# Patient Record
Sex: Female | Born: 2000 | Hispanic: No | Marital: Single | Smoking: Never smoker
Health system: Southern US, Community
[De-identification: ages and names within clinical notes are randomized; demographics above are authoritative.]

## PROBLEM LIST (undated history)

## (undated) DIAGNOSIS — F32A Depression, unspecified: Secondary | ICD-10-CM

## (undated) DIAGNOSIS — F419 Anxiety disorder, unspecified: Secondary | ICD-10-CM

## (undated) DIAGNOSIS — D573 Sickle-cell trait: Secondary | ICD-10-CM

## (undated) DIAGNOSIS — F909 Attention-deficit hyperactivity disorder, unspecified type: Secondary | ICD-10-CM

## (undated) HISTORY — DX: Depression, unspecified: F32.A

## (undated) HISTORY — DX: Attention-deficit hyperactivity disorder, unspecified type: F90.9

## (undated) HISTORY — PX: HERNIA REPAIR: SHX51

## (undated) HISTORY — DX: Sickle-cell trait: D57.3

## (undated) HISTORY — DX: Anxiety disorder, unspecified: F41.9

---
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
## 2020-07-21 ENCOUNTER — Ambulatory Visit: Payer: Self-pay | Admitting: Internal Medicine

## 2020-08-18 ENCOUNTER — Other Ambulatory Visit: Payer: Self-pay

## 2020-08-18 ENCOUNTER — Ambulatory Visit: Payer: 59 | Admitting: Nurse Practitioner

## 2020-08-18 ENCOUNTER — Encounter: Payer: Self-pay | Admitting: Nurse Practitioner

## 2020-08-18 ENCOUNTER — Other Ambulatory Visit (HOSPITAL_COMMUNITY)
Admission: RE | Admit: 2020-08-18 | Discharge: 2020-08-18 | Disposition: A | Discharge status: Home / Self Care | Payer: 59 | Source: Ambulatory Visit | Attending: Nurse Practitioner | Admitting: Nurse Practitioner

## 2020-08-18 VITALS — BP 100/68 | HR 98 | Temp 97.7°F | Ht 60.5 in | Wt 125.2 lb

## 2020-08-18 DIAGNOSIS — K219 Gastro-esophageal reflux disease without esophagitis: Secondary | ICD-10-CM | POA: Insufficient documentation

## 2020-08-18 DIAGNOSIS — J301 Allergic rhinitis due to pollen: Secondary | ICD-10-CM | POA: Insufficient documentation

## 2020-08-18 DIAGNOSIS — Z113 Encounter for screening for infections with a predominantly sexual mode of transmission: Secondary | ICD-10-CM | POA: Insufficient documentation

## 2020-08-18 DIAGNOSIS — B009 Herpesviral infection, unspecified: Secondary | ICD-10-CM | POA: Insufficient documentation

## 2020-08-18 DIAGNOSIS — R197 Diarrhea, unspecified: Secondary | ICD-10-CM | POA: Insufficient documentation

## 2020-08-18 DIAGNOSIS — F418 Other specified anxiety disorders: Secondary | ICD-10-CM | POA: Insufficient documentation

## 2020-08-18 DIAGNOSIS — F5104 Psychophysiologic insomnia: Secondary | ICD-10-CM | POA: Insufficient documentation

## 2020-08-18 DIAGNOSIS — Z3041 Encounter for surveillance of contraceptive pills: Secondary | ICD-10-CM

## 2020-08-18 DIAGNOSIS — Z91018 Allergy to other foods: Secondary | ICD-10-CM | POA: Insufficient documentation

## 2020-08-18 LAB — POCT URINE PREGNANCY: Preg Test, Ur: NEGATIVE

## 2020-08-18 NOTE — Progress Notes (Signed)
Subjective:  Patient ID: Erin Bentley, female    DOB: [birth year]  Age: 19 y.o. MRN: [id number]  CC: Establish Care (New patient/Would like to discuss birth control)  [place]. [place]ill on Junel Fe (OCP). Last OV with previous pcp was 06/2019 per patient. She is sexually active with occasional use of condoms. She denies any need for HIV screen. She does not use any tobacco product, no Hx of PE/DVT/migraines. FHx of breast cabcer (MGM at ago 68).   Reviewed past Medical, Social and Family history today.  Outpatient Medications Prior to Visit  Medication Sig Dispense Refill  . albuterol (VENTOLIN HFA) 108 (90 Base) MCG/ACT inhaler Inhale into the lungs.    . Cetirizine HCl 10 MG CAPS Take by mouth.    . cloNIDine (CATAPRES) 0.1 MG tablet 1 tablet in the evening    . FLUoxetine (PROZAC) 40 MG capsule Take 40 mg by mouth daily.    Marland Kitchen LORazepam (ATIVAN) 1 MG tablet Take 1 mg by mouth 2 (two) times daily.    . valACYclovir (VALTREX) 500 MG tablet 1 tablet as needed for cold sores    . norethindrone-ethinyl estradiol (JUNEL FE 1/20) 1-20 MG-MCG tablet 1 tablet     No facility-administered medications prior to visit.    ROS See HPI  Objective:  BP 100/68 (BP Location: Left Arm, Patient Position: Sitting, Cuff Size: Normal)   Pulse 98   Temp 97.7 F (36.5 C) (Temporal)   Ht 5' 0.5" (1.537 m)   Wt 125 lb 3.2 oz (56.8 kg)   SpO2 98%   BMI 24.05 kg/m   Physical Exam Vitals reviewed.  Cardiovascular:     Rate and Rhythm: Normal rate and regular rhythm.     Pulses: Normal pulses.     Heart sounds: Normal heart sounds.  Pulmonary:     Effort: Pulmonary effort is normal.     Breath sounds: Normal breath sounds.  Musculoskeletal:     Right lower leg: No edema.     Left lower leg: No edema.  Neurological:     Mental Status: She is alert and oriented to person, place, and time.  Psychiatric:        Mood and Affect: Mood normal.         Behavior: Behavior normal.        Thought Content: Thought content normal.    Assessment & Plan:  This visit occurred during the SARS-CoV-2 public health emergency.  Safety protocols were in place, including screening questions prior to the visit, additional usage of staff PPE, and extensive cleaning of exam room while observing appropriate contact time as indicated for disinfecting solutions.   Erin Bentley was seen today for establish care.  Diagnoses and all orders for this visit:  Encounter for surveillance of contraceptive pills -     POCT urine pregnancy -     norethindrone-ethinyl estradiol (JUNEL FE 1/20) 1-20 MG-MCG tablet; Take 1 tablet by mouth daily. Need CPE with PAP in order to get additional refills  Screen for STD (sexually transmitted disease) -     Urine cytology ancillary only([employer])  Advised about need for first PAP smear screen. She decided to schedule another appt.  Problem List Items Addressed This Visit    None    Visit Diagnoses    Encounter for surveillance of contraceptive pills    -  Primary   Relevant Medications   norethindrone-ethinyl estradiol (JUNEL FE 1/20) 1-20  MG-MCG tablet   Other Relevant Orders   POCT urine pregnancy (Completed)   Screen for STD (sexually transmitted disease)       Relevant Orders   Urine cytology ancillary only([facility]) (Completed)      Follow-up: Return in about 4 weeks (around 09/15/2020) for CPE (fasting).  Alysia Penna, NP

## 2020-08-18 NOTE — Patient Instructions (Signed)
Thank you for choosing [facility] for your health needs  Go to lab for urine collection.   What You Need to Know About Marijuana Use Marijuana is a mixture of the dried leaves and flowers of the hemp plant Cannabis sativa. The plant's active ingredients (cannabinoids) change the chemistry of the brain. If you smoke or eat marijuana, you will experience changes in the way you think, feel, and behave. Many people use marijuana because it helps them relax and puts them in a pleasurable mood (marijuana high). Some people use marijuana for medical effects, such as:  Reduced nausea.  Increased appetite.  Reduced muscle spasm.  Pain relief. Researchers are studying other possible medical uses for marijuana. How can marijuana use affect me? Many people find a marijuana high to be pleasurable and relaxing. Other people find a marijuana high to be uncomfortable or anxiety-causing. This drug can cause short-term and long-term physical and mental effects. Taking high doses of marijuana or trying to quit marijuana can also affect you. Short-term effects of marijuana use include:  Temporary relief of symptoms from a medical condition.  Changes in mood and perception (feeling high).  Increased hunger.  Increased heart rate.  Slowed movement and reaction time.  Poor memory, judgment, and problem solving ability.  Altered sense of time.  Changes to vision.  Bloodshot eyes.  Coughing. Long-term effects of marijuana use include:  Higher risk of lung and breathing problems.  Higher risk of heart attack.  Higher risk of testicular cancer.  Mental and physical dependence (addiction).  Slowed brain development in young people. Babies whose mothers used marijuana during pregnancy have an increased risk of problems with brain development and behavior.  Temporary periods of false perceptions or beliefs (hallucinations or paranoia).  Worsening of mental illness.  Onset of new  mental illness such as anxiety, depression, or suicidal thoughts.  Withdrawal symptoms when stopping marijuana, such as sleeplessness, anxiety, cravings, and anger.  Difficulty maintaining healthy relationships.  Poor memory, and difficulty concentrating and learning. This can result in decreased intelligence and poor performance at school or work, and an increased risk of dropping out of school.  Higher risk of using other substances like alcohol and nicotine. High doses of marijuana can cause:  Panic.  Anxiety.  Mental confusion.  Hallucinations. Quitting marijuana after using it for a long time can cause withdrawal symptoms, such as:  Headache.  Shakiness.  Sweating.  Stomach pain.  Nausea.  Restlessness.  Irritability.  Trouble sleeping.  Decreased appetite. What are the benefits of not using marijuana? Not using marijuana can keep you from becoming dependent on it. You can avoid the negative effects of the drug that can reduce your quality of life. You can avoid accidents caused by the slowed reaction time that is common with marijuana use. If I already use marijuana, what steps can I take to stop using it? If you are not physically or mentally dependent on marijuana, you should be able to stop using it on your own. If you cannot stop on your own, ask your health care provider for help. Treatment for marijuana addiction is similar to treatment for other addictions. It may include:  Cognitive-behavioral therapy (psychotherapy). This may include individual or group therapy.  Joining a support group.  Treating medical, behavioral, or mental health conditions that exist along with marijuana dependency.  Where can I get more information? Learn more about:  Marijuana from the [place] [employer] on Drug Abuse: Natworking.hu  Medical marijuana from the  [employer] of Health:  RunningShows.co.za  Treatment options from the Substance Abuse and Mental Health Services Administration: https://findtreatment.http://gonzalez-rivas.net/  Recovery from marijuana dependency from Recovery.org: http://www.recovery.org/topics/marijuana-recovery When should I seek medical care? Talk with your health care provider if:  You want to stop using marijuana but you cannot.  You have withdrawal symptoms when you try to stop using marijuana.  You are using marijuana every day.  You are using marijuana along with other drugs like cocaine or alcohol.  You have anxiety or depression.  You have hallucinations or paranoia.  Marijuana use is interfering with your relationships or your ability to function normally at school or at work. Summary  You may become physically or mentally dependent on marijuana.  Long-term use may interfere with your ability to function normally at home, school, or work.  Marijuana addiction is treatable. This information is not intended to replace advice given to you by your health care provider. Make sure you discuss any questions you have with your health care provider. Document Revised: 10/07/2017 Document Reviewed: 07/14/2016 Elsevier Patient Education  2020 [employer].

## 2020-08-19 LAB — URINE CYTOLOGY ANCILLARY ONLY
Chlamydia: NEGATIVE
Comment: NEGATIVE
Comment: NEGATIVE
Comment: NORMAL
Neisseria Gonorrhea: NEGATIVE
Trichomonas: NEGATIVE

## 2020-08-21 ENCOUNTER — Other Ambulatory Visit (INDEPENDENT_AMBULATORY_CARE_PROVIDER_SITE_OTHER): Payer: Managed Care, Other (non HMO)

## 2020-08-21 ENCOUNTER — Ambulatory Visit: Payer: Managed Care, Other (non HMO) | Admitting: Physician Assistant

## 2020-08-21 ENCOUNTER — Encounter: Payer: Self-pay | Admitting: Physician Assistant

## 2020-08-21 VITALS — BP 102/70 | HR 97 | Ht 60.0 in | Wt 124.4 lb

## 2020-08-21 DIAGNOSIS — R1013 Epigastric pain: Secondary | ICD-10-CM

## 2020-08-21 DIAGNOSIS — K219 Gastro-esophageal reflux disease without esophagitis: Secondary | ICD-10-CM | POA: Diagnosis not present

## 2020-08-21 DIAGNOSIS — R197 Diarrhea, unspecified: Secondary | ICD-10-CM | POA: Diagnosis not present

## 2020-08-21 DIAGNOSIS — R110 Nausea: Secondary | ICD-10-CM

## 2020-08-21 LAB — COMPREHENSIVE METABOLIC PANEL
ALT: 9 U/L (ref 0–35)
AST: 13 U/L (ref 0–37)
Albumin: 4.6 g/dL (ref 3.5–5.2)
Alkaline Phosphatase: 31 U/L — ABNORMAL LOW (ref 39–117)
BUN: 9 mg/dL (ref 6–23)
CO2: 28 mEq/L (ref 19–32)
Calcium: 9.4 mg/dL (ref 8.4–10.5)
Chloride: 103 mEq/L (ref 96–112)
Creatinine, Ser: 0.69 mg/dL (ref 0.40–1.20)
GFR: 124.08 mL/min (ref >60.00)
Glucose, Bld: 83 mg/dL (ref 70–99)
Potassium: 4.4 mEq/L (ref 3.5–5.1)
Sodium: 137 mEq/L (ref 135–145)
Total Bilirubin: 0.5 mg/dL (ref 0.2–1.2)
Total Protein: 7.6 g/dL (ref 6.0–8.3)

## 2020-08-21 LAB — CBC WITH DIFFERENTIAL/PLATELET
Basophils Absolute: 0.1 10*3/uL (ref 0.0–0.1)
Basophils Relative: 0.8 % (ref 0.0–3.0)
Eosinophils Absolute: 0.4 10*3/uL (ref 0.0–0.7)
Eosinophils Relative: 4.6 % (ref 0.0–5.0)
HCT: 38.5 % (ref 36.0–46.0)
Hemoglobin: 13.5 g/dL (ref 12.0–15.0)
Lymphocytes Relative: 30.2 % (ref 12.0–46.0)
Lymphs Abs: 2.6 10*3/uL (ref 0.7–4.0)
MCHC: 35.1 g/dL (ref 30.0–36.0)
MCV: 83.5 fl (ref 78.0–100.0)
Monocytes Absolute: 0.6 10*3/uL (ref 0.1–1.0)
Monocytes Relative: 6.6 % (ref 3.0–12.0)
Neutro Abs: 5 10*3/uL (ref 1.4–7.7)
Neutrophils Relative %: 57.8 % (ref 43.0–77.0)
Platelets: 365 10*3/uL (ref 150.0–400.0)
RBC: 4.61 Mil/uL (ref 3.87–5.11)
RDW: 12.6 % (ref 11.5–15.5)
WBC: 8.6 10*3/uL (ref 4.0–10.5)

## 2020-08-21 LAB — LIPASE: Lipase: 11 U/L (ref 11.0–59.0)

## 2020-08-21 MED ORDER — PANTOPRAZOLE SODIUM 20 MG PO TBEC: 20.0000 mg | DELAYED_RELEASE_TABLET | Freq: Two times a day (BID) | ORAL | 1 refills | Status: DC

## 2020-08-21 MED ORDER — NORETHIN ACE-ETH ESTRAD-FE 1-20 MG-MCG PO TABS: 1.0000 | ORAL_TABLET | Freq: Every day | ORAL | 0 refills | Status: DC

## 2020-08-21 NOTE — Patient Instructions (Addendum)
If you are age 21 or older, your body mass index should be between 23-30. Your Body mass index is 24.29 kg/m. If this is out of the aforementioned range listed, please consider follow up with your Primary Care Provider.  If you are age 68 or younger, your body mass index should be between 19-25. Your Body mass index is 24.29 kg/m. If this is out of the aformentioned range listed, please consider follow up with your Primary Care Provider.   Your provider has requested that you go to the basement level for lab work before leaving today. Press "B" on the elevator. The lab is located at the first door on the left as you exit the elevator.  Due to recent changes in healthcare laws, you may see the results of your imaging and laboratory studies on MyChart before your provider has had a chance to review them.  We understand that in some cases there may be results that are confusing or concerning to you. Not all laboratory results come back in the same time frame and the provider may be waiting for multiple results in order to interpret others.  Please give [place] 48 hours in order for your provider to thoroughly review all the results before contacting the office for clarification of your results.   We have sent the following medications to your pharmacy for you to pick up at your convenience:  START: pantoprazole 20mg  take one tablet twice daily 30 to 60 minutes before breakfast and dinner.  You are scheduled for a follow up appointment in the office on 09-25-20 at 3:30pm.  You have been scheduled for an abdominal ultrasound at [facility] (1st floor of hospital) on 08-27-20 at 8:30am. Please arrive 15 minutes prior to your appointment for registration. Make certain not to have anything to eat or drink 6 hours prior to your appointment. Should you need to reschedule your appointment, please contact radiology at [phone]. This test typically takes about 30 minutes to perform.  Thank you for  entrusting me with your care and choosing [facility].  [facility]

## 2020-08-21 NOTE — Progress Notes (Signed)
Reviewed and agree with management plans. ? ?Adylin Hankey L. Verland Sprinkle, MD, MPH  ?

## 2020-08-21 NOTE — Progress Notes (Signed)
Chief Complaint: "Gastrointestinal issues"  HPI:    Erin Bentley is a 19 year old female with a past medical history as listed below including anxiety and depression, who presents to clinic today with a complaint of "gastrointestinal issues".    Today, the patient presents to clinic and explains that in May she started having a lot of GI issues.  Apparently she was started on Prozac around that time was told this could give her abdominal issues but she stopped this and her problems have continued.  Has lost about 15 pounds since May.  Apparently went to the urgent care initially (we cannot see these records) and was given a diagnosis of hepatitis A, but then followed with her PCP and was told that there was no sign of hepatitis A.  Also had celiac testing which was negative.  Tells me that everything she eats seems to make her sick.  She is constantly nauseous, worse with some foods especially fast food which seems to make things "10 times worse".  Has tried to cut out dairy and gluten which make no real difference.  Describes reflux symptoms and epigastric pain,which is sharp/cramping, which hurts about 20 to 30 minutes after eating and will last for a couple of hours.  Also stopped nicotine usage about 2 months ago but this has not stopped her symptoms.  Tells me that she will have diarrhea after eating anything which is a loose liquid stool.    Does have a history of anxiety but tells me that she does not feel like this is worse than it has been before, no extra stress in her life.    Currently going to cosmetology school.    Denies fever, chills, symptoms that awaken her from sleep or blood in her stool.  Past Medical History:  Diagnosis Date  . ADHD   . Anxiety   . Depression     Past Surgical History:  Procedure Laterality Date  . HERNIA REPAIR     Age 19  . WISDOM TOOTH EXTRACTION  2015    Current Outpatient Medications  Medication Sig Dispense Refill  . albuterol (VENTOLIN HFA) 108  (90 Base) MCG/ACT inhaler Inhale into the lungs.    . Cetirizine HCl 10 MG CAPS Take by mouth.    . cloNIDine (CATAPRES) 0.1 MG tablet 1 tablet in the evening    . norethindrone-ethinyl estradiol (JUNEL FE 1/20) 1-20 MG-MCG tablet Take 1 tablet by mouth daily. Need CPE with PAP in order to get additional refills 90 tablet 0  . valACYclovir (VALTREX) 500 MG tablet 1 tablet as needed for cold sores     No current facility-administered medications for this visit.    Allergies as of 08/21/2020 - Review Complete 08/18/2020  Allergen Reaction Noted  . Apple Itching and Swelling 09/11/2014  . Other Itching, Swelling, and Hives 09/11/2014    Family History  Problem Relation Age of Onset  . Arthritis Maternal Grandmother   . Diabetes Maternal Grandfather   . Diabetes Paternal Grandmother   . Cancer Paternal Grandmother 41       breast  . Diabetes Paternal Grandfather   . Heart disease Paternal Grandfather   . Colon cancer Neg Hx   . Stomach cancer Neg Hx   . Pancreatic cancer Neg Hx     Social History   Socioeconomic History  . Marital status: Single    Spouse name: Not on file  . Number of children: 0  . Years of education: Not on file  .  Highest education level: Not on file  Occupational History  . Not on file  Tobacco Use  . Smoking status: Never Smoker  . Smokeless tobacco: Never Used  Vaping Use  . Vaping Use: Some days  . Substances: Nicotine, Flavoring  Substance and Sexual Activity  . Alcohol use: Yes    Comment: Occasionally  . Drug use: Yes    Frequency: 7.0 times per week    Types: Marijuana    Comment: Daily  . Sexual activity: Yes    Birth control/protection: Condom, Pill  Other Topics Concern  . Not on file  Social History Narrative  . Not on file   Social Determinants of Health   Financial Resource Strain:   . Difficulty of Paying Living Expenses: Not on file  Food Insecurity:   . Worried About Programme researcher, broadcasting/film/video in the Last Year: Not on file  .  Ran Out of Food in the Last Year: Not on file  Transportation Needs:   . Lack of Transportation (Medical): Not on file  . Lack of Transportation (Non-Medical): Not on file  Physical Activity:   . Days of Exercise per Week: Not on file  . Minutes of Exercise per Session: Not on file  Stress:   . Feeling of Stress : Not on file  Social Connections:   . Frequency of Communication with Friends and Family: Not on file  . Frequency of Social Gatherings with Friends and Family: Not on file  . Attends Religious Services: Not on file  . Active Member of Clubs or Organizations: Not on file  . Attends Banker Meetings: Not on file  . Marital Status: Not on file  Intimate Partner Violence:   . Fear of Current or Ex-Partner: Not on file  . Emotionally Abused: Not on file  . Physically Abused: Not on file  . Sexually Abused: Not on file    Review of Systems:    Constitutional: No weight loss, fever or chills Skin: No rash  Cardiovascular: No chest pai Respiratory: No SOB  Gastrointestinal: See HPI and otherwise negative Genitourinary: No dysuria  Neurological: No headache, dizziness or syncope Musculoskeletal: No new muscle or joint pain Hematologic: No bleeding  Psychiatric:+ anxiety   Physical Exam:  Vital signs: BP 102/70   Pulse 97   Ht 5' (1.524 m)   Wt 124 lb 6 oz (56.4 kg)   BMI 24.29 kg/m   Constitutional:   Pleasant female appears to be in NAD, Well developed, Well nourished, alert and cooperative Head:  Normocephalic and atraumatic. Eyes:   PEERL, EOMI. No icterus. Conjunctiva pink. Ears:  Normal auditory acuity. Neck:  Supple Throat: Oral cavity and pharynx without inflammation, swelling or lesion.  Respiratory: Respirations even and unlabored. Lungs clear to auscultation bilaterally.   No wheezes, crackles, or rhonchi.  Cardiovascular: Normal S1, S2. No MRG. Regular rate and rhythm. No peripheral edema, cyanosis or pallor.  Gastrointestinal:  Soft,  nondistended, marked epigastric/RUQ ttp with involuntary guarding. Normal bowel sounds. No appreciable masses or hepatomegaly. Rectal:  Not performed.  Msk:  Symmetrical without gross deformities. Without edema, no deformity or joint abnormality.  Neurologic:  Alert and  oriented x4;  grossly normal neurologically.  Skin:   Dry and intact without significant lesions or rashes. Psychiatric:  Demonstrates good judgement and reason without abnormal affect or behaviors.  No recent labs or imaging.  Assessment: 1.  Epigastric/right upper quadrant pain: Over the past 5 months, a 15 pound weight loss related  to pain after eating which resulted in diarrhea, constantly nauseous with reflux symptoms; consider gastritis+/-PUD+/-GERD versus gallbladder origin 2.  Diarrhea: With above, consider IBS versus infectious cause versus other 3.  Nausea 4.  GERD  Plan: 1.  Ordered right upper quadrant ultrasound 2.  Ordered CBC, CMP and lipase. 3.  Ordered a GI pathogen panel 4.  Prescribed Pantoprazole 20 mg twice daily, 20-30 minutes before breakfast and dinner #60 with 3 refills. 5.  Patient to follow in clinic with me after labs and testing above in about 4 to 6 weeks.  If symptoms continue then would consider an EGD.  Patient assigned to Dr. Orvan Falconer today.  Hyacinth Meeker, PA-C [facility] 08/21/2020, 3:34 PM  Cc: No ref. provider found

## 2020-08-26 ENCOUNTER — Telehealth: Payer: Self-pay | Admitting: Physician Assistant

## 2020-08-26 NOTE — Telephone Encounter (Signed)
See results note. 

## 2020-08-26 NOTE — Telephone Encounter (Signed)
Pt is requesting a call back from Patty. 

## 2020-08-27 ENCOUNTER — Ambulatory Visit (HOSPITAL_COMMUNITY)
Admission: RE | Admit: 2020-08-27 | Discharge: 2020-08-27 | Disposition: A | Discharge status: Home / Self Care | Payer: 59 | Source: Ambulatory Visit | Attending: Physician Assistant | Admitting: Physician Assistant

## 2020-08-27 ENCOUNTER — Other Ambulatory Visit: Payer: Self-pay

## 2020-08-27 DIAGNOSIS — R197 Diarrhea, unspecified: Secondary | ICD-10-CM | POA: Insufficient documentation

## 2020-08-27 DIAGNOSIS — R1013 Epigastric pain: Secondary | ICD-10-CM | POA: Diagnosis present

## 2020-08-27 DIAGNOSIS — R110 Nausea: Secondary | ICD-10-CM | POA: Diagnosis present

## 2020-08-27 DIAGNOSIS — K219 Gastro-esophageal reflux disease without esophagitis: Secondary | ICD-10-CM | POA: Diagnosis present

## 2020-09-12 ENCOUNTER — Other Ambulatory Visit: Payer: Self-pay | Admitting: Physician Assistant

## 2020-09-25 ENCOUNTER — Encounter: Payer: Self-pay | Admitting: Physician Assistant

## 2020-09-25 ENCOUNTER — Ambulatory Visit: Payer: 59 | Admitting: Physician Assistant

## 2020-09-25 VITALS — BP 112/60 | HR 115 | Ht 60.0 in | Wt 127.8 lb

## 2020-09-25 DIAGNOSIS — K5900 Constipation, unspecified: Secondary | ICD-10-CM | POA: Diagnosis not present

## 2020-09-25 DIAGNOSIS — R140 Abdominal distension (gaseous): Secondary | ICD-10-CM | POA: Diagnosis not present

## 2020-09-25 NOTE — Progress Notes (Signed)
Chief Complaint: Follow-up diarrhea, GERD and abdominal pain, new complaint of constipation and bloating  HPI:    Erin Bentley is a 19 year old female, assigned to Dr. Orvan Falconer at last visit, with a past medical history of anxiety and depression, who returns to clinic today for follow-up of her diarrhea, GERD and abdominal pain, new complaint of constipation and bloating.    08/21/2020 patient seen in clinic and described that in May of this year she started with a lot of GI issues.  Lost about 15 pounds since then, had celiac testing which was negative and described that anytime she would eat something to make her sick.  She is constantly nauseous worse with fast food, tried to cut out dairy and gluten which made no difference, also describes reflux symptoms and epigastric pain.  Ordered a right upper quadrant ultrasound, CBC, CMP and lipase as well as a GI pathogen panel.  Prescribed pantoprazole 20 mg twice daily.  Discussed that if her symptoms continue then would recommend an EGD.    08/21/2020 CBC, CMP and lipase normal.    08/27/2020 right upper quadrant ultrasound was normal.    Today, the patient tells me she is completely altered her diet, she is no longer eating a lot of fatty fast food and instead is cooking at home and her symptoms are 90% better, no longer experiencing diarrhea, abdominal pain or reflux.  She is now experiencing some bloating and tells me that she will sometimes even wake up in the morning bloated.  Tells me she is also having less frequent stools with a bowel movement every other day or every 2 days when she was used to going every day.  Tells me she does drink a lot of water and is pretty active, but the bloating is bothersome to her.    Also describes that she has not had a period for 3 months.  She is going to follow-up with her PCP about this.    Denies fever, chills, weight loss, continued reflux, diarrhea or abdominal pain.  Past Medical History:  Diagnosis Date  .  ADHD   . Anxiety   . Depression     Past Surgical History:  Procedure Laterality Date  . HERNIA REPAIR     Age 59  . WISDOM TOOTH EXTRACTION  2015    Current Outpatient Medications  Medication Sig Dispense Refill  . albuterol (VENTOLIN HFA) 108 (90 Base) MCG/ACT inhaler Inhale into the lungs.    . Cetirizine HCl 10 MG CAPS Take by mouth.    . cloNIDine (CATAPRES) 0.1 MG tablet 1 tablet in the evening    . norethindrone-ethinyl estradiol (JUNEL FE 1/20) 1-20 MG-MCG tablet Take 1 tablet by mouth daily. Need CPE with PAP in order to get additional refills 90 tablet 0  . pantoprazole (PROTONIX) 20 MG tablet TAKE 1 TABLET BY MOUTH TWICE A DAY 60 tablet 5  . valACYclovir (VALTREX) 500 MG tablet 1 tablet as needed for cold sores     No current facility-administered medications for this visit.    Allergies as of 09/25/2020 - Review Complete 08/21/2020  Allergen Reaction Noted  . Apple Itching and Swelling 09/11/2014  . Other Itching, Swelling, and Hives 09/11/2014    Family History  Problem Relation Age of Onset  . Arthritis Maternal Grandmother   . Diabetes Maternal Grandfather   . Diabetes Paternal Grandmother   . Cancer Paternal Grandmother 13       breast  . Diabetes Paternal Grandfather   .  Heart disease Paternal Grandfather   . Colon cancer Neg Hx   . Stomach cancer Neg Hx   . Pancreatic cancer Neg Hx     Social History   Socioeconomic History  . Marital status: Single    Spouse name: Not on file  . Number of children: 0  . Years of education: Not on file  . Highest education level: Not on file  Occupational History  . Not on file  Tobacco Use  . Smoking status: Never Smoker  . Smokeless tobacco: Never Used  Vaping Use  . Vaping Use: Some days  . Substances: Nicotine, Flavoring  Substance and Sexual Activity  . Alcohol use: Yes    Comment: Occasionally  . Drug use: Yes    Frequency: 7.0 times per week    Types: Marijuana    Comment: Daily  . Sexual  activity: Yes    Birth control/protection: Condom, Pill  Other Topics Concern  . Not on file  Social History Narrative  . Not on file   Social Determinants of Health   Financial Resource Strain:   . Difficulty of Paying Living Expenses: Not on file  Food Insecurity:   . Worried About Programme researcher, broadcasting/film/video in the Last Year: Not on file  . Ran Out of Food in the Last Year: Not on file  Transportation Needs:   . Lack of Transportation (Medical): Not on file  . Lack of Transportation (Non-Medical): Not on file  Physical Activity:   . Days of Exercise per Week: Not on file  . Minutes of Exercise per Session: Not on file  Stress:   . Feeling of Stress : Not on file  Social Connections:   . Frequency of Communication with Friends and Family: Not on file  . Frequency of Social Gatherings with Friends and Family: Not on file  . Attends Religious Services: Not on file  . Active Member of Clubs or Organizations: Not on file  . Attends Banker Meetings: Not on file  . Marital Status: Not on file  Intimate Partner Violence:   . Fear of Current or Ex-Partner: Not on file  . Emotionally Abused: Not on file  . Physically Abused: Not on file  . Sexually Abused: Not on file    Review of Systems:    Constitutional: No weight loss, fever or chills Cardiovascular: No chest pain  Respiratory: No SOB  Gastrointestinal: See HPI and otherwise negative   Physical Exam:  Vital signs: BP 112/60   Pulse (!) 115   Ht 5' (1.524 m)   Wt 127 lb 12.8 oz (58 kg)   SpO2 98%   BMI 24.96 kg/m   Constitutional:   Pleasant female appears to be in NAD, Well developed, Well nourished, alert and cooperative  Respiratory: Respirations even and unlabored. Lungs clear to auscultation bilaterally.   No wheezes, crackles, or rhonchi.  Cardiovascular: Normal S1, S2. No MRG. Regular rate and rhythm. No peripheral edema, cyanosis or pallor.  Gastrointestinal:  Soft, mild distension, nontender. No  rebound or guarding. Normal bowel sounds. No appreciable masses or hepatomegaly. Rectal:  Not performed.  Psychiatric:  Demonstrates good judgement and reason without abnormal affect or behaviors.  RELEVANT LABS AND IMAGING: CBC    Component Value Date/Time   WBC 8.6 08/21/2020 1618   RBC 4.61 08/21/2020 1618   HGB 13.5 08/21/2020 1618   HCT 38.5 08/21/2020 1618   PLT 365.0 08/21/2020 1618   MCV 83.5 08/21/2020 1618   MCHC  35.1 08/21/2020 1618   RDW 12.6 08/21/2020 1618   LYMPHSABS 2.6 08/21/2020 1618   MONOABS 0.6 08/21/2020 1618   EOSABS 0.4 08/21/2020 1618   BASOSABS 0.1 08/21/2020 1618    CMP     Component Value Date/Time   NA 137 08/21/2020 1618   K 4.4 08/21/2020 1618   CL 103 08/21/2020 1618   CO2 28 08/21/2020 1618   GLUCOSE 83 08/21/2020 1618   BUN 9 08/21/2020 1618   CREATININE 0.69 08/21/2020 1618   CALCIUM 9.4 08/21/2020 1618   PROT 7.6 08/21/2020 1618   ALBUMIN 4.6 08/21/2020 1618   AST 13 08/21/2020 1618   ALT 9 08/21/2020 1618   ALKPHOS 31 (L) 08/21/2020 1618   BILITOT 0.5 08/21/2020 1618    Assessment: 1.  Bloating: On a daily basis, distended and somewhat uncomfortable 2.  Constipation: Over the past couple of weeks a bowel movement every 3 days or so; consider relation to new diet, likely causing above 3.  GERD/abdominal pain/diarrhea: Likely all diet related as this is better when she is not eating fast food  Plan: 1.  Discussed the bloating is likely related to constipation. 2.  Encouraged the patient to continue her water intake of the 6-8 eight ounce glasses of water per day. 3.  Recommend the patient start MiraLAX, a half a dose a day, this can be titrated as needed. 4.  Continue healthy diet. 5.  Patient to follow in clinic with me in 3-4 months or sooner if necessary.  Hyacinth Meeker, PA-C [facility] 09/25/2020, 3:24 PM  Cc: Anne Ng, NP

## 2020-09-25 NOTE — Patient Instructions (Signed)
If you are age 21 or older, your body mass index should be between 23-30. Your Body mass index is 24.96 kg/m. If this is out of the aforementioned range listed, please consider follow up with your Primary Care Provider.  If you are age 51 or younger, your body mass index should be between 19-25. Your Body mass index is 24.96 kg/m. If this is out of the aformentioned range listed, please consider follow up with your Primary Care Provider.   Start Miralax 1/2 dose daily.  Continue drinking water 6-8 oz glasses daily.  Thank you for choosing me and [facility].  Hyacinth Meeker, PA-C

## 2020-09-25 NOTE — Progress Notes (Signed)
Reviewed and agree with management plans. ? ?Murry Diaz L. Vitoria Conyer, MD, MPH  ?

## 2020-12-01 ENCOUNTER — Other Ambulatory Visit: Payer: Self-pay | Admitting: Nurse Practitioner

## 2020-12-01 DIAGNOSIS — Z3041 Encounter for surveillance of contraceptive pills: Secondary | ICD-10-CM

## 2020-12-28 ENCOUNTER — Other Ambulatory Visit: Payer: Self-pay

## 2020-12-28 ENCOUNTER — Inpatient Hospital Stay (HOSPITAL_BASED_OUTPATIENT_CLINIC_OR_DEPARTMENT_OTHER): Payer: 59

## 2020-12-28 ENCOUNTER — Encounter (HOSPITAL_COMMUNITY): Payer: Self-pay | Admitting: Emergency Medicine

## 2020-12-28 ENCOUNTER — Inpatient Hospital Stay (HOSPITAL_COMMUNITY)
Admission: AD | Admit: 2020-12-28 | Discharge: 2020-12-28 | Disposition: A | Discharge status: Home / Self Care | Payer: 59 | Attending: Obstetrics & Gynecology | Admitting: Obstetrics & Gynecology

## 2020-12-28 DIAGNOSIS — N86 Erosion and ectropion of cervix uteri: Secondary | ICD-10-CM | POA: Insufficient documentation

## 2020-12-28 DIAGNOSIS — O209 Hemorrhage in early pregnancy, unspecified: Secondary | ICD-10-CM | POA: Insufficient documentation

## 2020-12-28 DIAGNOSIS — O43122 Velamentous insertion of umbilical cord, second trimester: Secondary | ICD-10-CM | POA: Insufficient documentation

## 2020-12-28 DIAGNOSIS — N898 Other specified noninflammatory disorders of vagina: Secondary | ICD-10-CM

## 2020-12-28 DIAGNOSIS — O26899 Other specified pregnancy related conditions, unspecified trimester: Secondary | ICD-10-CM

## 2020-12-28 DIAGNOSIS — O43192 Other malformation of placenta, second trimester: Secondary | ICD-10-CM

## 2020-12-28 DIAGNOSIS — Z3A18 18 weeks gestation of pregnancy: Secondary | ICD-10-CM

## 2020-12-28 DIAGNOSIS — Z6710 Type A blood, Rh positive: Secondary | ICD-10-CM

## 2020-12-28 DIAGNOSIS — O208 Other hemorrhage in early pregnancy: Secondary | ICD-10-CM

## 2020-12-28 DIAGNOSIS — O4690 Antepartum hemorrhage, unspecified, unspecified trimester: Secondary | ICD-10-CM

## 2020-12-28 LAB — URINALYSIS, ROUTINE W REFLEX MICROSCOPIC
Bilirubin Urine: NEGATIVE
Glucose, UA: NEGATIVE mg/dL
Hgb urine dipstick: NEGATIVE
Ketones, ur: NEGATIVE mg/dL
Leukocytes,Ua: NEGATIVE
Nitrite: NEGATIVE
Protein, ur: NEGATIVE mg/dL
Specific Gravity, Urine: 1.01 (ref 1.005–1.030)
pH: 7 (ref 5.0–8.0)

## 2020-12-28 LAB — WET PREP, GENITAL
Clue Cells Wet Prep HPF POC: NONE SEEN
Sperm: NONE SEEN
Trich, Wet Prep: NONE SEEN
Yeast Wet Prep HPF POC: NONE SEEN

## 2020-12-28 LAB — TYPE AND SCREEN
ABO/RH(D): A POS
Antibody Screen: NEGATIVE

## 2020-12-28 LAB — PREGNANCY, URINE: Preg Test, Ur: POSITIVE — AB

## 2020-12-28 NOTE — MAU Provider Note (Signed)
History     CSN: [id number]  Arrival date and time: 12/28/20 9485   Event Date/Time   First Provider Initiated Contact with Patient 12/28/20 2135      Chief Complaint  Patient presents with  . Vaginal Bleeding   HPI   Ms.Erin Bentley is a 20 y.o. female G1P0 @ [redacted]w[redacted]d here in MAU with complaints of vaginal bleeding. She went to the bathroom around 5:30 pm and noticed bright, fresh blood dripping into the vagina. She called her OB office and was instructed to come to the ER. She presented to [facility] ED and was transferred to MAU for evaluation. She has no pain currently. She does get occasional sharp, shooting pain in her lower pelvis.  When she used the bathroom in MAU the blood was no longer dripping, however she did notice darker blood when she wiped. No recent intercourse, no placenta concerns that she is aware of.   OB History    Gravida  1   Para      Term      Preterm      AB      Living        SAB      IAB      Ectopic      Multiple      Live Births              Past Medical History:  Diagnosis Date  . ADHD   . Anxiety   . Depression   . Sickle cell trait [facility])     Past Surgical History:  Procedure Laterality Date  . HERNIA REPAIR     Age 47  . WISDOM TOOTH EXTRACTION  2015    Family History  Problem Relation Age of Onset  . Arthritis Maternal Grandmother   . Diabetes Maternal Grandfather   . Diabetes Paternal Grandmother   . Cancer Paternal Grandmother 23       breast  . Diabetes Paternal Grandfather   . Heart disease Paternal Grandfather   . Colon cancer Neg Hx   . Stomach cancer Neg Hx   . Pancreatic cancer Neg Hx     Social History   Tobacco Use  . Smoking status: Never Smoker  . Smokeless tobacco: Never Used  Vaping Use  . Vaping Use: Some days  . Substances: Nicotine, Flavoring  Substance Use Topics  . Alcohol use: Yes    Comment: Occasionally  . Drug use: Yes    Frequency: 7.0 times per week    Types: Marijuana     Comment: Daily    Allergies:  Allergies  Allergen Reactions  . Apple Itching and Swelling  . Other Itching, Swelling and Hives    Trees, grass, pollen, dust, pets, mold    Medications Prior to Admission  Medication Sig Dispense Refill Last Dose  . albuterol (VENTOLIN HFA) 108 (90 Base) MCG/ACT inhaler Inhale 1 puff into the lungs as needed.      . Cetirizine HCl 10 MG CAPS Take 1 capsule by mouth as needed.      . cloNIDine (CATAPRES) 0.1 MG tablet Take 0.1 mg by mouth every evening.      Colleen Can FE 1/20 1-20 MG-MCG tablet TAKE 1 TABLET BY MOUTH DAILY. NEED CPE WITH PAP IN ORDER TO GET ADDITIONAL REFILLS 84 tablet 1   . pantoprazole (PROTONIX) 20 MG tablet TAKE 1 TABLET BY MOUTH TWICE A DAY (Patient not taking: Reported on 09/25/2020) 60 tablet 5   .  valACYclovir (VALTREX) 500 MG tablet Take 500 mg by mouth as needed.       Recent Results (from the past 2160 hour(s))  Pregnancy, urine     Status: Abnormal   Collection Time: 12/28/20  8:04 PM  Result Value Ref Range   Preg Test, Ur POSITIVE (A) NEGATIVE    Comment:        THE SENSITIVITY OF THIS METHODOLOGY IS >20 mIU/mL. Performed at [facility], [address] [place], [place] [address]   Urinalysis, Routine w reflex microscopic Urine, Clean Catch     Status: Abnormal   Collection Time: 12/28/20  9:37 PM  Result Value Ref Range   Color, Urine STRAW (A) YELLOW   APPearance CLEAR CLEAR   Specific Gravity, Urine 1.010 1.005 - 1.030   pH 7.0 5.0 - 8.0   Glucose, UA NEGATIVE NEGATIVE mg/dL   Hgb urine dipstick NEGATIVE NEGATIVE   Bilirubin Urine NEGATIVE NEGATIVE   Ketones, ur NEGATIVE NEGATIVE mg/dL   Protein, ur NEGATIVE NEGATIVE mg/dL   Nitrite NEGATIVE NEGATIVE   Leukocytes,Ua NEGATIVE NEGATIVE    Comment: Performed at [facility], [address] [place], [place] [address]  Wet prep, genital     Status: Abnormal   Collection Time: 12/28/20  9:48 PM   Specimen: Cervix  Result Value Ref Range   Yeast Wet  Prep HPF POC NONE SEEN NONE SEEN   Trich, Wet Prep NONE SEEN NONE SEEN   Clue Cells Wet Prep HPF POC NONE SEEN NONE SEEN   WBC, Wet Prep HPF POC MANY (A) NONE SEEN   Sperm NONE SEEN     Comment: Performed at [facility], [address] [place], [place] [address]  GC/Chlamydia probe amp ([place])not at [facility]     Status: None   Collection Time: 12/28/20  9:48 PM  Result Value Ref Range   Chlamydia Negative    Neisseria Gonorrhea Negative    Comment Normal Reference Ranger Chlamydia - Negative    Comment      Normal Reference Range Neisseria Gonorrhea - Negative  Type and screen [facility]     Status: None   Collection Time: 12/28/20 10:34 PM  Result Value Ref Range   ABO/RH(D) A POS    Antibody Screen NEG    Sample Expiration      12/31/2020,2359 Performed at [facility], [address] [place], [place] [address]    US MFM OB LIMITED  Result Date: 12/29/2020 ----------------------------------------------------------------------  OBSTETRICS REPORT                        (Signed Final 12/29/2020 11:48 am) ---------------------------------------------------------------------- Patient Info  ID #:       [id number]                          D.O.B.:  2000-12-23 (19 yrs)  Name:       Erin Bentley                  Visit Date: 12/28/2020 10:07 pm ---------------------------------------------------------------------- Performed By  Attending:        Lin Landsmanorenthian Booker      Ref. Address:      [address]                    MD  [address] [place],                                                              [place] [address]  Performed By:     Earley Brooke     Secondary Phy.:    [facility] MAU/Triage                    BS, RDMS  Referred By:      Harolyn Rutherford             Location[facility]          [facility] NP                                  [facility]  ---------------------------------------------------------------------- Orders  #  Description                           Code        Ordered By  1  [place] MFM OB LIMITED                     [phone]    Dallen Bunte ----------------------------------------------------------------------  #  Order #                     Accession #                Episode #  1  [id number]                   [id number]                 [id number] ---------------------------------------------------------------------- Indications  Vaginal bleeding in pregnancy, second           O46.92  trimester  [redacted] weeks gestation of pregnancy                 Z3A.18 ---------------------------------------------------------------------- Fetal Evaluation  Num Of Fetuses:          1  Fetal Heart Rate(bpm):   149  Cardiac Activity:        Observed  Presentation:            Cephalic  Placenta:                Anterior  P. Cord Insertion:       Marginal insertion  Amniotic Fluid  AFI FV:      Within normal limits                              Largest Pocket(cm)                              6.2  Comment:    No placental abruption or previa identified by ultrasound. ---------------------------------------------------------------------- OB History  Gravidity:    1         Term:   0        Prem:  0        SAB:   0  TOP:          0       Ectopic:  0        Living: 0 ---------------------------------------------------------------------- Gestational Age  Clinical EDD:  18w 0d                                        EDD:   05/31/21  Best:          18w 0d     Det. By:  Clinical EDD             EDD:   05/31/21 ---------------------------------------------------------------------- Cervix Uterus Adnexa  Cervix  Length:           3.64  cm.  Normal appearance by transabdominal scan.  Right Ovary  Within normal limits.  Left Ovary  Not visualized.  Adnexa  No abnormality visualized. ---------------------------------------------------------------------- Impression  Limited exam to  assess vaginal bleeding  There is good fetal movement and amniotic fluid  There is no evidence of placental previa or placenta previa  We observed a marginal cord insertion. ---------------------------------------------------------------------- Recommendations  Follow up detailed exam at the first available appointment. ----------------------------------------------------------------------               Lin Landsman, MD Electronically Signed Final Report   12/29/2020 11:48 am ----------------------------------------------------------------------   Review of Systems  Gastrointestinal: Negative for abdominal pain.  Genitourinary: Negative for decreased urine volume, vaginal bleeding, vaginal discharge and vaginal pain.   Physical Exam   Blood pressure 107/70, pulse 99, temperature 99 F (37.2 C), temperature source Oral, resp. rate 19, height 5' (1.524 m), weight 62.1 kg, SpO2 100 %.  Physical Exam Constitutional:      General: She is not in acute distress.    Appearance: Normal appearance. She is normal weight. She is not ill-appearing, toxic-appearing or diaphoretic.  HENT:     Head: Normocephalic.  Abdominal:     Palpations: Abdomen is soft.     Tenderness: There is no abdominal tenderness.  Genitourinary:    Comments: Vagina - Small amount of white vaginal discharge, no odor, no brown discharge  Cervix - No contact bleeding, no active bleeding; ectropion noted  Bimanual exam: deferred, cervix appears closed  GC/Chlam, wet prep done Chaperone present for exam.  Neurological:     Mental Status: She is alert and oriented to person, place, and time.  Psychiatric:        Behavior: Behavior normal.     MAU Course  Procedures  MDM  + fetal heart tones via doppler A positive blood type OB limited US  Assessment and Plan   A:  1. Vaginal discharge during pregnancy, antepartum   2. Vaginal bleeding during pregnancy   3. Ectropion of cervix   4. [redacted] weeks gestation of  pregnancy   5. Type A blood, Rh positive   6. Marginal insertion of umbilical cord affecting management of mother in second trimester     P:  Discharge home in stable condition F/u with OB as scheduled Return to MAU if symptoms worsen Pelvic rest  Lynsi Dooner, Harolyn Rutherford, NP 12/31/2020 11:02 AM

## 2020-12-28 NOTE — ED Notes (Signed)
PA to see pt.  Victorino Dike provider at [facility] accepted pt.  RN spoke to [employer] who gave permission to go.  PT transport.

## 2020-12-28 NOTE — ED Provider Notes (Signed)
Emergency Medicine Provider OB Triage Evaluation Note  Erin Bentley is a 20 y.o. female, No obstetric history on file., at Unknown gestation who presents to the emergency department with complaints of vaginal bleeding.  Patient reports is [redacted] weeks pregnant, first pregnancy.  Around 530 this afternoon she noticed vaginal bleeding and lower abdominal cramping sensation which is been mild intermittent no clear aggravating or alleviating factors no radiation of pain.  She was informed by her OB/GYN to come to this ED for evaluation.  Review of  Systems  Positive: Vaginal bleeding, lower abdominal cramping Negative: Fever chills chest pain dysuria hematuria injury or any additional concerns.  Physical Exam  BP 123/76 (BP Location: Right Arm)   Pulse 97   Temp 98.1 F (36.7 C) (Oral)   Resp 18   Ht 5' (1.524 m)   Wt 61.2 kg   SpO2 100%   BMI 26.35 kg/m  General: Awake, no distress  HEENT: Atraumatic  Resp: Normal effort  Cardiac: Normal rate Abd: Nondistended, nontender  MSK: Moves all extremities without difficulty Neuro: Speech clear  Medical Decision Making  Pt evaluated for pregnancy concern and is stable for transfer to MAU. Pt is in agreement with plan for transfer.  Pregnancy test in the ER is positive  8:42 PM Discussed with MAU APP, Victorino Dike, who accepts patient in transfer.  Clinical Impression  No diagnosis found.     Elizabeth Palau 12/28/20 2043    Cheryll Cockayne, MD 12/29/20 [phone]

## 2020-12-28 NOTE — ED Triage Notes (Signed)
Pt reports she is [redacted] weeks pregnant and began to have vaginal bleeding tonight.  Her OB instructed her to come to ED.  Only "minor cramping."

## 2020-12-28 NOTE — Discharge Instructions (Signed)
Activity Restriction During Pregnancy Your health care provider may recommend specific activity restrictions during pregnancy for a variety of reasons. Activity restriction may require that you limit activities that require great effort, such as exercise, lifting, or sex. The type of activity restriction will vary for each person, depending on your risk or the problems you are having. Activity restriction may be recommended for a period of time until your baby is delivered. Why are activity restrictions recommended? Activity restriction may be recommended if:  Your placenta is partially or completely covering the opening of your cervix (placenta previa).  There is bleeding between the wall of the uterus and the amniotic sac in the first trimester of pregnancy (subchorionic hemorrhage).  You went into labor too early (preterm labor).  You have a history of miscarriage.  You have a condition that causes high blood pressure during pregnancy (preeclampsia or eclampsia).  You are pregnant with more than one baby.  Your baby is not growing well. What are the risks? The risks depend on your specific restriction. Strict bed rest has the most physical and emotional risks and is no longer routinely recommended. Risks of strict bed rest include:  Loss of muscle conditioning from not moving.  Blood clots.  Social isolation.  Depression.  Loss of income. Talk with your health care team about activity restriction to decide if it is best for you and your baby. Even if you are having problems during your pregnancy, you may be able to continue with normal levels of activity with careful monitoring by your health care team. Follow these instructions at home: If needed, based on your overall health and the health of your baby, your health care provider will decide which type of activity restriction is right for you. Activity restrictions may include:  Not lifting anything heavier than 10 pounds (4.5  kg).  Avoiding activities that take a lot of physical effort.  No lifting or straining.  Resting in a sitting position or lying down for periods of time during the day. Pelvic rest may be recommended along with activity restrictions. If pelvic rest is recommended, then:  Do not have sex, an orgasm, or use sexual stimulation.  Do not use tampons. Do not douche. Do not put anything into your vagina.  Do not lift anything that is heavier than 10 lb (4.5 kg).  Avoid activities that require a lot of effort.  Avoid any activity in which your pelvic muscles could become strained, such as squatting.   Questions to ask your health care provider  Why is my activity being limited?  How will activity restrictions affect my body?  Why is rest helpful for me and my baby?  What activities can I do?  When can I return to normal activities? When should I seek immediate medical care? Seek immediate medical care if you have:  Vaginal bleeding.  Vaginal discharge.  Cramping pain in your lower abdomen.  Regular contractions.  A low, dull backache. Summary  Your health care provider may recommend specific activity restrictions during pregnancy for a variety of reasons.  Activity restriction may require that you limit activities such as exercise, lifting, sex, or any other activity that requires great effort.  Discuss the risks and benefits of activity restriction with your health care team to decide if it is best for you and your baby.  Contact your health care provider right away if you think you are having contractions, or if you notice vaginal bleeding, discharge, or cramping. This information   is not intended to replace advice given to you by your health care provider. Make sure you discuss any questions you have with your health care provider. Document Revised: 07/18/2019 Document Reviewed: 02/14/2018 Elsevier Patient Education  2021 Elsevier Inc.  

## 2020-12-28 NOTE — MAU Note (Signed)
.  Erin Bentley is a 20 y.o. at approx [redacted]w[redacted]d in MAU reporting: When she woke up from her nap around 1715, she went to use the restroom and reports blood "was dripping out of me and was bright red like period blood." She is not currently wearing a pad and reports she has not felt any blood dripping since. She is endorsing lower abdominal aching/cramping that is occasionally sharp but states this has been occurring since the beginning of her second trimester. Last intercourse two days ago.   Pain score: 6/10 lower abdominal aching/cramping  OB/GYN care at [facility]. Next appt 01/06/2021.

## 2020-12-29 LAB — GC/CHLAMYDIA PROBE AMP (~~LOC~~) NOT AT ARMC
Chlamydia: NEGATIVE
Comment: NEGATIVE
Comment: NORMAL
Neisseria Gonorrhea: NEGATIVE

## 2021-02-24 ENCOUNTER — Inpatient Hospital Stay (HOSPITAL_COMMUNITY)
Admission: AD | Admit: 2021-02-24 | Discharge: 2021-02-24 | Disposition: A | Discharge status: Home / Self Care | Payer: 59 | Attending: Obstetrics & Gynecology | Admitting: Obstetrics & Gynecology

## 2021-02-24 ENCOUNTER — Inpatient Hospital Stay (HOSPITAL_COMMUNITY): Payer: 59

## 2021-02-24 ENCOUNTER — Other Ambulatory Visit: Payer: Self-pay

## 2021-02-24 ENCOUNTER — Encounter (HOSPITAL_COMMUNITY): Payer: Self-pay | Admitting: Obstetrics & Gynecology

## 2021-02-24 DIAGNOSIS — O368120 Decreased fetal movements, second trimester, not applicable or unspecified: Secondary | ICD-10-CM | POA: Insufficient documentation

## 2021-02-24 DIAGNOSIS — R059 Cough, unspecified: Secondary | ICD-10-CM

## 2021-02-24 DIAGNOSIS — J101 Influenza due to other identified influenza virus with other respiratory manifestations: Secondary | ICD-10-CM | POA: Diagnosis not present

## 2021-02-24 DIAGNOSIS — O99891 Other specified diseases and conditions complicating pregnancy: Secondary | ICD-10-CM | POA: Diagnosis not present

## 2021-02-24 DIAGNOSIS — Z3A26 26 weeks gestation of pregnancy: Secondary | ICD-10-CM | POA: Diagnosis not present

## 2021-02-24 DIAGNOSIS — M549 Dorsalgia, unspecified: Secondary | ICD-10-CM | POA: Diagnosis not present

## 2021-02-24 DIAGNOSIS — O99512 Diseases of the respiratory system complicating pregnancy, second trimester: Secondary | ICD-10-CM | POA: Diagnosis not present

## 2021-02-24 DIAGNOSIS — Z20822 Contact with and (suspected) exposure to covid-19: Secondary | ICD-10-CM | POA: Diagnosis not present

## 2021-02-24 LAB — COMPREHENSIVE METABOLIC PANEL
ALT: 24 U/L (ref 0–44)
AST: 23 U/L (ref 15–41)
Albumin: 3.5 g/dL (ref 3.5–5.0)
Alkaline Phosphatase: 69 U/L (ref 38–126)
Anion gap: 9 (ref 5–15)
BUN: 5 mg/dL — ABNORMAL LOW (ref 6–20)
CO2: 21 mmol/L — ABNORMAL LOW (ref 22–32)
Calcium: 9.1 mg/dL (ref 8.9–10.3)
Chloride: 101 mmol/L (ref 98–111)
Creatinine, Ser: 0.57 mg/dL (ref 0.44–1.00)
GFR, Estimated: >60 mL/min (ref >60)
Glucose, Bld: 92 mg/dL (ref 70–99)
Potassium: 3.6 mmol/L (ref 3.5–5.1)
Sodium: 131 mmol/L — ABNORMAL LOW (ref 135–145)
Total Bilirubin: 0.4 mg/dL (ref 0.3–1.2)
Total Protein: 7 g/dL (ref 6.5–8.1)

## 2021-02-24 LAB — CBC WITH DIFFERENTIAL/PLATELET
Abs Immature Granulocytes: 0.12 10*3/uL — ABNORMAL HIGH (ref 0.00–0.07)
Basophils Absolute: 0 10*3/uL (ref 0.0–0.1)
Basophils Relative: 0 %
Eosinophils Absolute: 0 10*3/uL (ref 0.0–0.5)
Eosinophils Relative: 0 %
HCT: 34.3 % — ABNORMAL LOW (ref 36.0–46.0)
Hemoglobin: 12.1 g/dL (ref 12.0–15.0)
Immature Granulocytes: 1 %
Lymphocytes Relative: 4 %
Lymphs Abs: 0.5 10*3/uL — ABNORMAL LOW (ref 0.7–4.0)
MCH: 28.3 pg (ref 26.0–34.0)
MCHC: 35.3 g/dL (ref 30.0–36.0)
MCV: 80.3 fL (ref 80.0–100.0)
Monocytes Absolute: 1.2 10*3/uL — ABNORMAL HIGH (ref 0.1–1.0)
Monocytes Relative: 10 %
Neutro Abs: 10.1 10*3/uL — ABNORMAL HIGH (ref 1.7–7.7)
Neutrophils Relative %: 85 %
Platelets: 282 10*3/uL (ref 150–400)
RBC: 4.27 MIL/uL (ref 3.87–5.11)
RDW: 12.9 % (ref 11.5–15.5)
WBC: 12 10*3/uL — ABNORMAL HIGH (ref 4.0–10.5)
nRBC: 0 % (ref 0.0–0.2)

## 2021-02-24 LAB — URINALYSIS, ROUTINE W REFLEX MICROSCOPIC
Bilirubin Urine: NEGATIVE
Glucose, UA: NEGATIVE mg/dL
Ketones, ur: NEGATIVE mg/dL
Leukocytes,Ua: NEGATIVE
Nitrite: NEGATIVE
Protein, ur: NEGATIVE mg/dL
Specific Gravity, Urine: 1.001 — ABNORMAL LOW (ref 1.005–1.030)
pH: 7 (ref 5.0–8.0)

## 2021-02-24 LAB — RESP PANEL BY RT-PCR (FLU A&B, COVID) ARPGX2
Influenza A by PCR: POSITIVE — AB
Influenza B by PCR: NEGATIVE
SARS Coronavirus 2 by RT PCR: NEGATIVE

## 2021-02-24 MED ORDER — ACETAMINOPHEN 500 MG PO TABS
1000.0000 mg | ORAL_TABLET | Freq: Once | ORAL | Status: AC
  Administered 2021-02-24: 1000 mg via ORAL
  Filled 2021-02-24: qty 2

## 2021-02-24 MED ORDER — CYCLOBENZAPRINE HCL 10 MG PO TABS
10.0000 mg | ORAL_TABLET | Freq: Three times a day (TID) | ORAL | 0 refills | Status: AC | PRN
Start: 2021-02-24 — End: ?

## 2021-02-24 MED ORDER — CYCLOBENZAPRINE HCL 5 MG PO TABS
10.0000 mg | ORAL_TABLET | Freq: Once | ORAL | Status: DC
  Filled 2021-02-24: qty 2

## 2021-02-24 MED ORDER — LACTATED RINGERS IV BOLUS
1000.0000 mL | Freq: Once | INTRAVENOUS | Status: AC
  Administered 2021-02-24: 1000 mL via INTRAVENOUS

## 2021-02-24 NOTE — MAU Note (Signed)
Pt reports fever off/on all day today (100.5), mid lower back pain all day today and is getting worse. Denies bleeding. Reports no fetal movement today (fht's 175 in triage.)

## 2021-02-24 NOTE — MAU Provider Note (Signed)
Chief Complaint:  Back Pain, Fever, and Decreased Fetal Movement   Event Date/Time   [facility] Provider Initiated Contact with Patient 02/24/21 0225     HPI: Erin Bentley is a 20 y.o. G1P0 at [redacted]w[redacted]d who presents to maternity admissions reporting worsening cough, low back pain, fever and general feeling of malaise. Also has occasional nausea. Has had this for the past week, but the back pain has gotten worse throughout the day. Has not felt the baby move much at all today but denies vaginal bleeding/discharge, contractions, cramping or loss of fluid. Was worked up for this a week ago, tested negative 3x for Covid.  Pregnancy Course: Receives care at [facility]  Past Medical History:  Diagnosis Date  . ADHD   . Anxiety   . Depression   . Sickle cell trait (HCC)    OB History  Gravida Para Term Preterm AB Living  1            SAB IAB Ectopic Multiple Live Births               # Outcome Date GA Lbr Len/2nd Weight Sex Delivery Anes PTL Lv  1 Current            Past Surgical History:  Procedure Laterality Date  . HERNIA REPAIR     Age 65  . WISDOM TOOTH EXTRACTION  2015   Family History  Problem Relation Age of Onset  . Arthritis Maternal Grandmother   . Diabetes Maternal Grandfather   . Diabetes Paternal Grandmother   . Cancer Paternal Grandmother 61       breast  . Diabetes Paternal Grandfather   . Heart disease Paternal Grandfather   . Colon cancer Neg Hx   . Stomach cancer Neg Hx   . Pancreatic cancer Neg Hx    Social History   Tobacco Use  . Smoking status: Never Smoker  . Smokeless tobacco: Never Used  Vaping Use  . Vaping Use: Some days  . Substances: Nicotine, Flavoring  Substance Use Topics  . Alcohol use: Not Currently    Comment: Occasionally  . Drug use: Not Currently    Frequency: 7.0 times per week    Types: Marijuana    Comment: not currently   Allergies  Allergen Reactions  . Apple Itching and Swelling  . Other Itching, Swelling and Hives     Trees, grass, pollen, dust, pets, mold   Medications Prior to Admission  Medication Sig Dispense Refill Last Dose  . Prenatal Vit-Fe Fumarate-FA (PRENATAL MULTIVITAMIN) TABS tablet Take 1 tablet by mouth daily at 12 noon.   02/23/2021 at Unknown time  . albuterol (VENTOLIN HFA) 108 (90 Base) MCG/ACT inhaler Inhale 1 puff into the lungs as needed.      . Cetirizine HCl 10 MG CAPS Take 1 capsule by mouth as needed.      . pantoprazole (PROTONIX) 20 MG tablet TAKE 1 TABLET BY MOUTH TWICE A DAY (Patient not taking: Reported on 09/25/2020) 60 tablet 5   . valACYclovir (VALTREX) 500 MG tablet Take 500 mg by mouth as needed.        I have reviewed patient's Past Medical Hx, Surgical Hx, Family Hx, Social Hx, medications and allergies.   ROS:  Review of Systems  Constitutional: Positive for appetite change (not hungry), fatigue and fever.  HENT: Positive for congestion. Negative for sore throat.   Eyes: Negative for visual disturbance.  Respiratory: Positive for cough. Negative for shortness of breath.  Gastrointestinal: Positive for nausea. Negative for abdominal pain and vomiting.  Genitourinary: Negative for dysuria, vaginal bleeding and vaginal discharge.  Musculoskeletal: Positive for back pain (middle to lower back).  Neurological: Negative for dizziness, syncope, light-headedness and headaches.    Physical Exam   Patient Vitals for the past 24 hrs:  BP Temp Temp src Pulse Resp SpO2 Height Weight  02/24/21 0133 119/71 99.5 F (37.5 C) Oral (!) 131 17 98 % 5' (1.524 m) 155 lb (70.3 kg)   Constitutional: Well-developed, well-nourished female acute distress, crying r/t back pain  Cardiovascular: tachycardic, normal rhythm Respiratory: normal effort, lung sounds clear throughout GI: Abd soft, non-tender, gravid appropriate for gestational age MS: Extremities nontender, no edema, normal ROM Neurologic: Alert and oriented x 4.  GU: no CVA tenderness Pelvic exam deferred  Fetal  Tracing: initially tachycardic, overall reassuring and reactive Baseline: 175, came down to 150 after Tylenol and fluids Variability: moderate Accelerations: 10x10 (appropriate for gestational age) Decelerations: had occasional decels that resolved with position changes. Toco: mostly relaxed, some UI   Labs: Results for orders placed or performed during the hospital encounter of 02/24/21 (from the past 24 hour(s))  Urinalysis, Routine w reflex microscopic Urine, Clean Catch     Status: Abnormal   Collection Time: 02/24/21  1:51 AM  Result Value Ref Range   Color, Urine COLORLESS (A) YELLOW   APPearance CLEAR CLEAR   Specific Gravity, Urine 1.001 (L) 1.005 - 1.030   pH 7.0 5.0 - 8.0   Glucose, UA NEGATIVE NEGATIVE mg/dL   Hgb urine dipstick SMALL (A) NEGATIVE   Bilirubin Urine NEGATIVE NEGATIVE   Ketones, ur NEGATIVE NEGATIVE mg/dL   Protein, ur NEGATIVE NEGATIVE mg/dL   Nitrite NEGATIVE NEGATIVE   Leukocytes,Ua NEGATIVE NEGATIVE   RBC / HPF 0-5 0 - 5 RBC/hpf   Bacteria, UA RARE (A) NONE SEEN  CBC with Differential/Platelet     Status: Abnormal   Collection Time: 02/24/21  2:38 AM  Result Value Ref Range   WBC 12.0 (H) 4.0 - 10.5 K/uL   RBC 4.27 3.87 - 5.11 MIL/uL   Hemoglobin 12.1 12.0 - 15.0 g/dL   HCT 40.9 (L) 73.5 - 32.9 %   MCV 80.3 80.0 - 100.0 fL   MCH 28.3 26.0 - 34.0 pg   MCHC 35.3 30.0 - 36.0 g/dL   RDW 92.4 26.8 - 34.1 %   Platelets 282 150 - 400 K/uL   nRBC 0.0 0.0 - 0.2 %   Neutrophils Relative % 85 %   Neutro Abs 10.1 (H) 1.7 - 7.7 K/uL   Lymphocytes Relative 4 %   Lymphs Abs 0.5 (L) 0.7 - 4.0 K/uL   Monocytes Relative 10 %   Monocytes Absolute 1.2 (H) 0.1 - 1.0 K/uL   Eosinophils Relative 0 %   Eosinophils Absolute 0.0 0.0 - 0.5 K/uL   Basophils Relative 0 %   Basophils Absolute 0.0 0.0 - 0.1 K/uL   Immature Granulocytes 1 %   Abs Immature Granulocytes 0.12 (H) 0.00 - 0.07 K/uL  Comprehensive metabolic panel     Status: Abnormal   Collection Time:  02/24/21  2:38 AM  Result Value Ref Range   Sodium 131 (L) 135 - 145 mmol/L   Potassium 3.6 3.5 - 5.1 mmol/L   Chloride 101 98 - 111 mmol/L   CO2 21 (L) 22 - 32 mmol/L   Glucose, Bld 92 70 - 99 mg/dL   BUN 5 (L) 6 - 20 mg/dL   Creatinine,  Ser 0.57 0.44 - 1.00 mg/dL   Calcium 9.1 8.9 - 16.110.3 mg/dL   Total Protein 7.0 6.5 - 8.1 g/dL   Albumin 3.5 3.5 - 5.0 g/dL   AST 23 15 - 41 U/L   ALT 24 0 - 44 U/L   Alkaline Phosphatase 69 38 - 126 U/L   Total Bilirubin 0.4 0.3 - 1.2 mg/dL   GFR, Estimated >09>60 >60>60 mL/min   Anion gap 9 5 - 15  Resp Panel by RT-PCR (Flu A&B, Covid) Nasopharyngeal Swab     Status: Abnormal   Collection Time: 02/24/21  3:05 AM   Specimen: Nasopharyngeal Swab; Nasopharyngeal(NP) swabs in vial transport medium  Result Value Ref Range   SARS Coronavirus 2 by RT PCR NEGATIVE NEGATIVE   Influenza A by PCR POSITIVE (A) NEGATIVE   Influenza B by PCR NEGATIVE NEGATIVE   Imaging:  US RENAL  Result Date: 02/24/2021 CLINICAL DATA:  Back pain.  Pregnancy. EXAM: RENAL / URINARY TRACT ULTRASOUND COMPLETE COMPARISON:  None. FINDINGS: Right Kidney: Renal measurements: 11.9 x 5.2 x 5.6 cm = volume: 187 mL. Mild hydronephrosis. No nephrolithiasis. No solid mass. Left Kidney: Renal measurements: 12.3 x 6.2 x 5.7 cm = volume: 224 mL. Echogenicity within normal limits. No mass or hydronephrosis visualized. Bladder: Appears normal for degree of bladder distention. Other: None. IMPRESSION: Mild right hydronephrosis. Electronically Signed   By: Deatra RobinsonKevin  Herman M.D.   On: 02/24/2021 03:45   DG CHEST PORT 1 VIEW  Result Date: 02/24/2021 CLINICAL DATA:  Cough in an adult patient. EXAM: PORTABLE CHEST 1 VIEW COMPARISON:  None. FINDINGS: The heart size and mediastinal contours are within normal limits. Both lungs are clear. The visualized skeletal structures are unremarkable. IMPRESSION: Negative portable chest. Electronically Signed   By: Marnee SpringJonathon  Watts M.D.   On: 02/24/2021 04:44   MAU  Course: Orders Placed This Encounter  Procedures  . Culture, OB Urine  . Resp Panel by RT-PCR (Flu A&B, Covid) Nasopharyngeal Swab  . US RENAL  . DG CHEST PORT 1 VIEW  . Urinalysis, Routine w reflex microscopic Urine, Clean Catch  . CBC with Differential/Platelet  . Comprehensive metabolic panel  . Discharge patient   Meds ordered this encounter  Medications  . lactated ringers bolus 1,000 mL  . acetaminophen (TYLENOL) tablet 1,000 mg  . cyclobenzaprine (FLEXERIL) tablet 10 mg  . cyclobenzaprine (FLEXERIL) 10 MG tablet    Sig: Take 1 tablet (10 mg total) by mouth 3 (three) times daily as needed for muscle spasms.    Dispense:  30 tablet    Refill:  0    Order Specific Question:   Supervising Provider    Answer:   Samara SnidePRATT, TANYA S [[id number]]   MDM: Tachycardia and back pain improved after tylenol and bolus, pt declined flexeril because she drove herself  Renal U/S and CXR normal  Nasal swab positive for flu A  Pt informed of results and given OTC treatment options, Tamiflu not indicated as symptoms began over 48hrs ago  Assessment: 1. Influenza A   2. Back pain affecting pregnancy in second trimester   3. Cough in adult patient     Plan: Discharge home in stable condition with 2nd trimester precautions.  Safe med list given in AVS   Follow-up Information    [place], [facility]. Go to.   Why: as scheduled for ongoing prenatal care Contact information: [address] [place] [id number] [phone]  Allergies as of 02/24/2021      Reactions   Apple Itching, Swelling   Other Itching, Swelling, Hives   Trees, grass, pollen, dust, pets, mold      Medication List    STOP taking these medications   pantoprazole 20 MG tablet Commonly known as: PROTONIX     TAKE these medications   albuterol 108 (90 Base) MCG/ACT inhaler Commonly known as: VENTOLIN HFA Inhale 1 puff into the lungs as needed.   Cetirizine HCl 10 MG  Caps Take 1 capsule by mouth as needed.   cyclobenzaprine 10 MG tablet Commonly known as: FLEXERIL Take 1 tablet (10 mg total) by mouth 3 (three) times daily as needed for muscle spasms.   prenatal multivitamin Tabs tablet Take 1 tablet by mouth daily at 12 noon.   valACYclovir 500 MG tablet Commonly known as: VALTREX Take 500 mg by mouth as needed.      Edd Arbour, CNM, MSN, IBCLC Certified Nurse Midwife, [facility]

## 2021-02-24 NOTE — Discharge Instructions (Signed)
Safe Medications in Pregnancy   Acne:  Benzoyl Peroxide  Salicylic Acid   Backache/Headache:  Tylenol: 2 regular strength every 4 hours OR        2 Extra strength every 6 hours   Colds/Coughs/Allergies:  Benadryl (alcohol free) 25 mg every 6 hours as needed  Breath right strips  Claritin  Cepacol throat lozenges  Chloraseptic throat spray  Cold-Eeze- up to three times per day  Cough drops, alcohol free  Flonase (by prescription only)  Guaifenesin  Mucinex  Robitussin DM (plain only, alcohol free)  Saline nasal spray/drops  Sudafed (pseudoephedrine) & Actifed * use only after [redacted] weeks gestation and if you do not have high blood pressure  Tylenol  Vicks Vaporub  Zinc lozenges  Zyrtec   Constipation:  Colace  Ducolax suppositories  Fleet enema  Glycerin suppositories  Metamucil  Milk of magnesia  Miralax  Senokot  Smooth move tea   Diarrhea:  Kaopectate  Imodium A-D   *NO pepto Bismol   Hemorrhoids:  Anusol  Anusol HC  Preparation H  Tucks   Indigestion:  Tums  Maalox  Mylanta  Zantac  Pepcid   Insomnia:  Benadryl (alcohol free) 25mg  every 6 hours as needed  Tylenol PM  Unisom, no Gelcaps   Leg Cramps:  Tums  MagGel   Nausea/Vomiting:  Bonine  Dramamine  Emetrol  Ginger extract  Sea bands  Meclizine  Nausea medication to take during pregnancy:  Unisom (doxylamine succinate 25 mg tablets) Take one tablet daily at bedtime. If symptoms are not adequately controlled, the dose can be increased to a maximum recommended dose of two tablets daily (1/2 tablet in the morning, 1/2 tablet mid-afternoon and one at bedtime).  Vitamin B6 100mg  tablets. Take one tablet twice a day (up to 200 mg per day).   Skin Rashes:  Aveeno products  Benadryl cream or 25mg  every 6 hours as needed  Calamine Lotion  1% cortisone cream   Yeast infection:  Gyne-lotrimin 7  Monistat 7    **If taking multiple medications, please check labels to avoid  duplicating the same active ingredients  **take medication as directed on the label  ** Do not exceed 4000 mg of tylenol in 24 hours  **Do not take medications that contain aspirin or ibuprofen            Influenza, Adult Influenza is also called "the flu." It is an infection in the lungs, nose, and throat (respiratory tract). It spreads easily from person to person (is contagious). The flu causes symptoms that are like a cold, along with high fever and body aches. What are the causes? This condition is caused by the influenza virus. You can get the virus by:  Breathing in droplets that are in the air after a person infected with the flu coughed or sneezed.  Touching something that has the virus on it and then touching your mouth, nose, or eyes. What increases the risk? Certain things may make you more likely to get the flu. These include:  Not washing your hands often.  Having close contact with many people during cold and flu season.  Touching your mouth, eyes, or nose without first washing your hands.  Not getting a flu shot every year. You may have a higher risk for the flu, and serious problems, such as a lung infection (pneumonia), if you:  Are older than 65.  Are pregnant.  Have a weakened disease-fighting system (immune system) because of a disease or because  you are taking certain medicines.  Have a long-term (chronic) condition, such as: ? Heart, kidney, or lung disease. ? Diabetes. ? Asthma.  Have a liver disorder.  Are very overweight (morbidly obese).  Have anemia. What are the signs or symptoms? Symptoms usually begin suddenly and last 4-14 days. They may include:  Fever and chills.  Headaches, body aches, or muscle aches.  Sore throat.  Cough.  Runny or stuffy (congested) nose.  Feeling discomfort in your chest.  Not wanting to eat as much as normal.  Feeling weak or tired.  Feeling dizzy.  Feeling sick to your stomach or throwing  up. How is this treated? If the flu is found early, you can be treated with antiviral medicine. This can help to reduce how bad the illness is and how long it lasts. This may be given by mouth or through an IV tube. Taking care of yourself at home can help your symptoms get better. Your doctor may want you to:  Take over-the-counter medicines.  Drink plenty of fluids. The flu often goes away on its own. If you have very bad symptoms or other problems, you may be treated in a hospital. Follow these instructions at home: Activity  Rest as needed. Get plenty of sleep.  Stay home from work or school as told by your doctor. ? Do not leave home until you do not have a fever for 24 hours without taking medicine. ? Leave home only to go to your doctor. Eating and drinking  Take an ORS (oral rehydration solution). This is a drink that is sold at pharmacies and stores.  Drink enough fluid to keep your pee pale yellow.  Drink clear fluids in small amounts as you are able. Clear fluids include: ? Water. ? Ice chips. ? Fruit juice mixed with water. ? Low-calorie sports drinks.  Eat bland foods that are easy to digest. Eat small amounts as you are able. These foods include: ? Bananas. ? Applesauce. ? Rice. ? Lean meats. ? Toast. ? Crackers.  Do not eat or drink: ? Fluids that have a lot of sugar or caffeine. ? Alcohol. ? Spicy or fatty foods. General instructions  Take over-the-counter and prescription medicines only as told by your doctor.  Use a cool mist humidifier to add moisture to the air in your home. This can make it easier for you to breathe. ? When using a cool mist humidifier, clean it daily. Empty water and replace with clean water.  Cover your mouth and nose when you cough or sneeze.  Wash your hands with soap and water often and for at least 20 seconds. This is also important after you cough or sneeze. If you cannot use soap and water, use alcohol-based hand  sanitizer.  Keep all follow-up visits.      How is this prevented?  Get a flu shot every year. You may get the flu shot in late summer, fall, or winter. Ask your doctor when you should get your flu shot.  Avoid contact with people who are sick during fall and winter. This is cold and flu season.   Contact a doctor if:  You get new symptoms.  You have: ? Chest pain. ? Watery poop (diarrhea). ? A fever.  Your cough gets worse.  You start to have more mucus.  You feel sick to your stomach.  You throw up. Get help right away if you:  Have shortness of breath.  Have trouble breathing.  Have skin or  nails that turn a bluish color.  Have very bad pain or stiffness in your neck.  Get a sudden headache.  Get sudden pain in your face or ear.  Cannot eat or drink without throwing up. These symptoms may represent a serious problem that is an emergency. Get medical help right away. Call your local emergency services (911 in the [place]  Do not wait to see if the symptoms will go away.  Do not drive yourself to the hospital. Summary  Influenza is also called "the flu." It is an infection in the lungs, nose, and throat. It spreads easily from person to person.  Take over-the-counter and prescription medicines only as told by your doctor.  Getting a flu shot every year is the best way to not get the flu. This information is not intended to replace advice given to you by your health care provider. Make sure you discuss any questions you have with your health care provider. Document Revised: 06/13/2020 Document Reviewed: 06/13/2020 Elsevier Patient Education  2021 [employer].

## 2021-02-25 LAB — CULTURE, OB URINE: Culture: NO GROWTH

## 2021-09-10 IMAGING — US US ABDOMEN LIMITED
1 series · 14 of 25 positions shown · non-contrast
Comparison: None

CLINICAL DATA: Epigastric pain, diarrhea, weight loss

EXAM:
ULTRASOUND ABDOMEN LIMITED RIGHT UPPER QUADRANT

[Series 1: us abdomen limited · 14 of 43 slices shown]
[im 1/43]
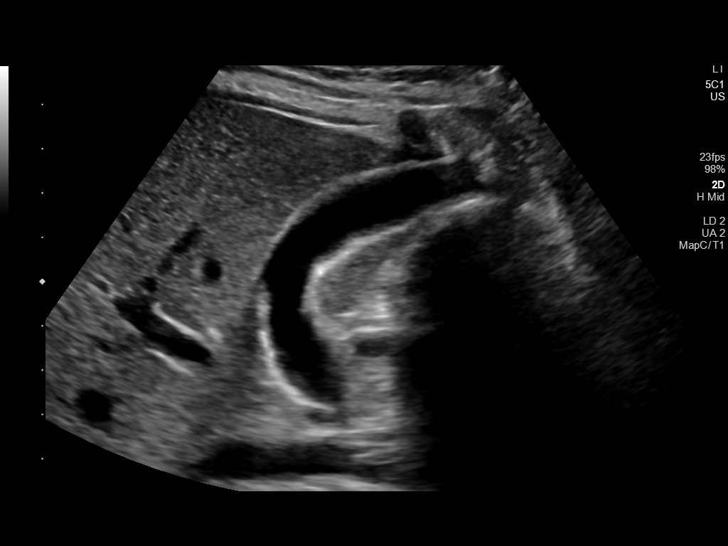
[im 4/43]
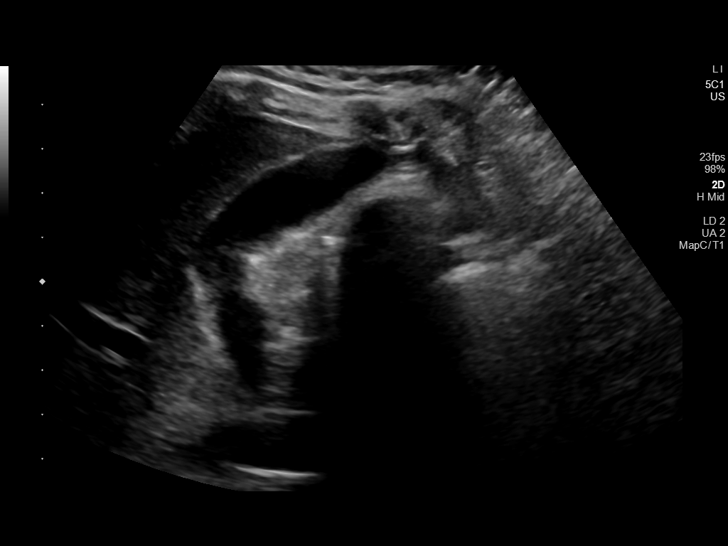
[im 8/43]
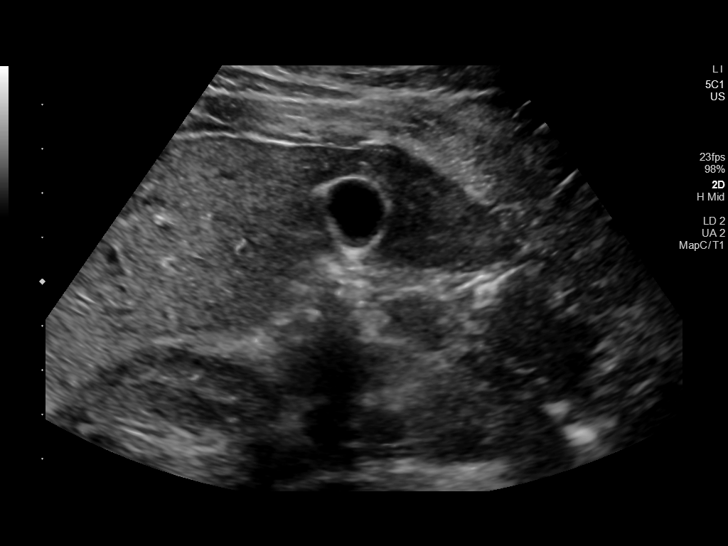
[im 11/43]
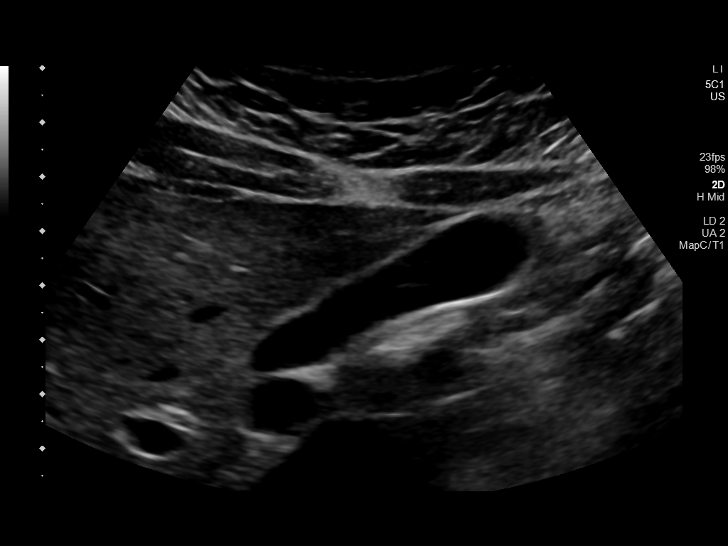
[im 15/43]
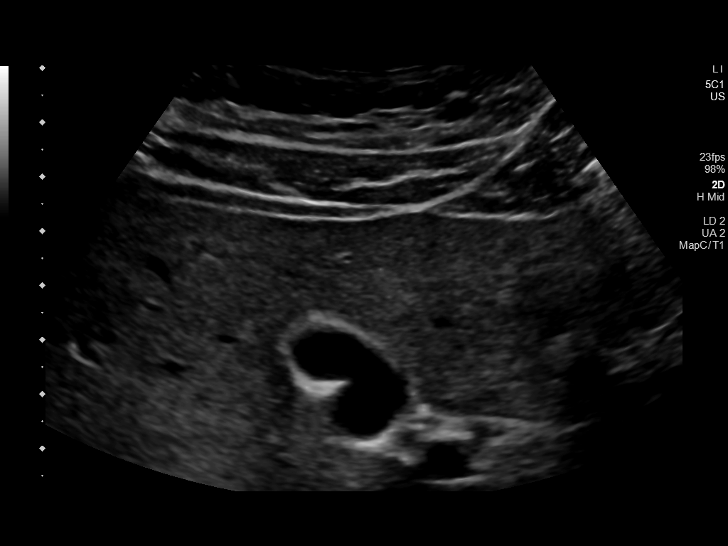
[im 16/43]
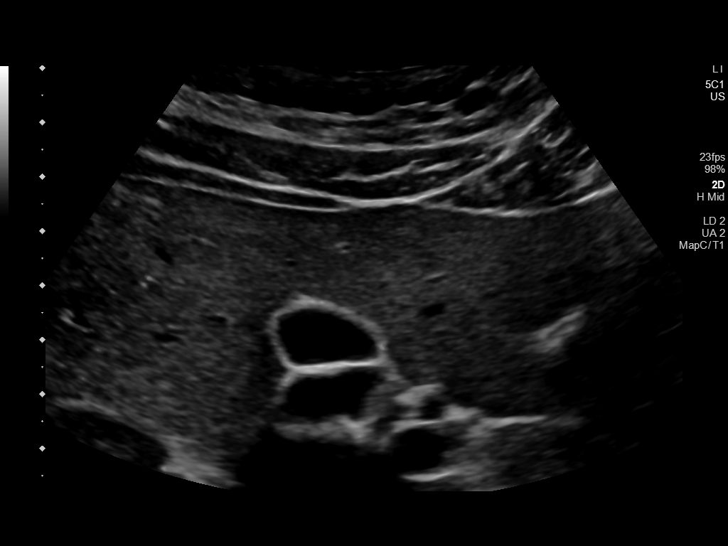
[im 20/43]
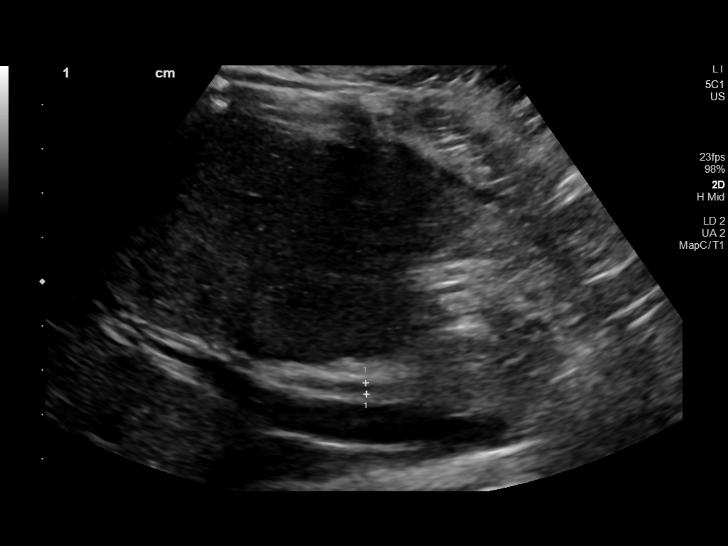
[im 23/43]
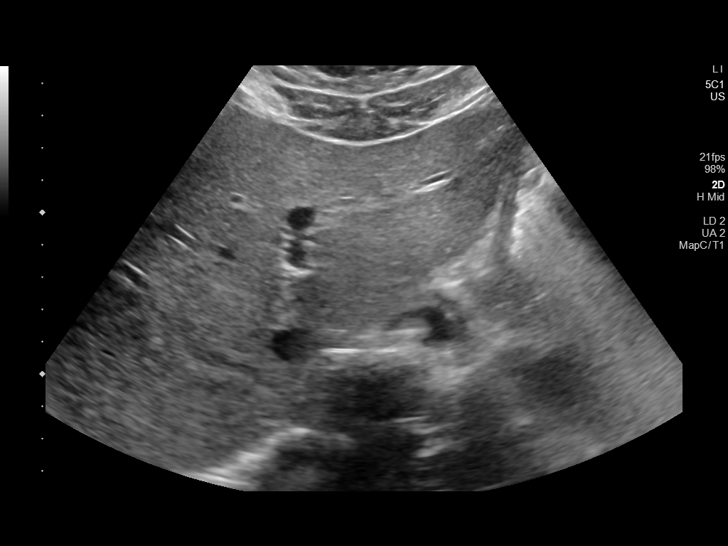
[im 27/43]
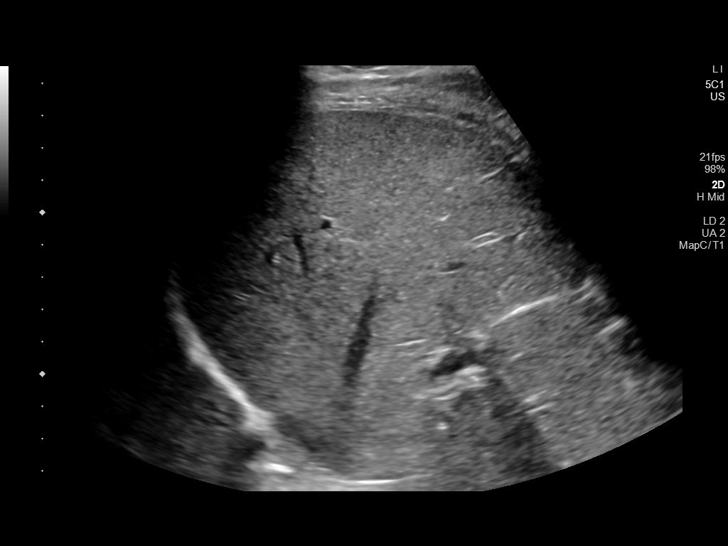
[im 29/43]
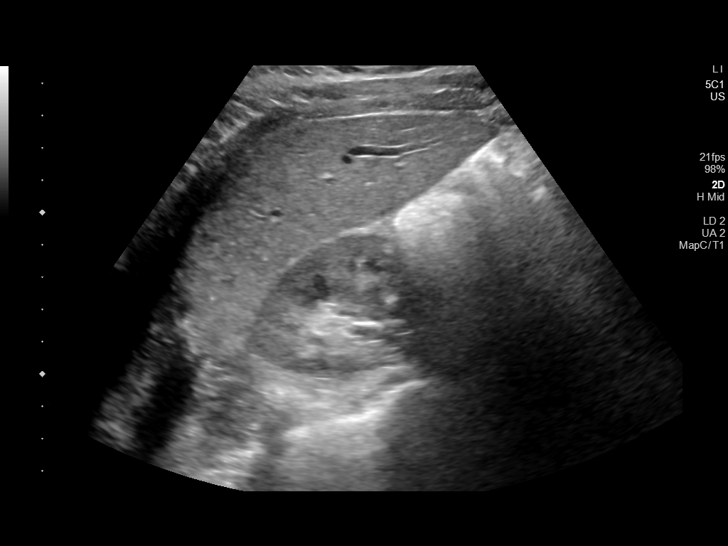
[im 32/43]
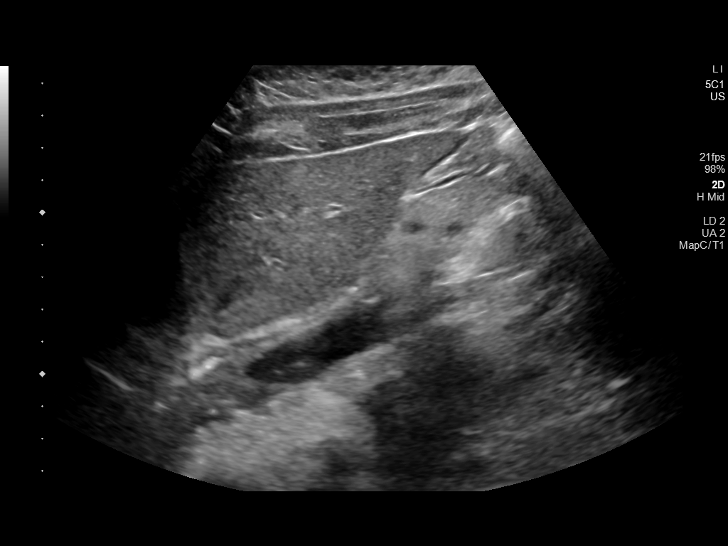
[im 36/43]
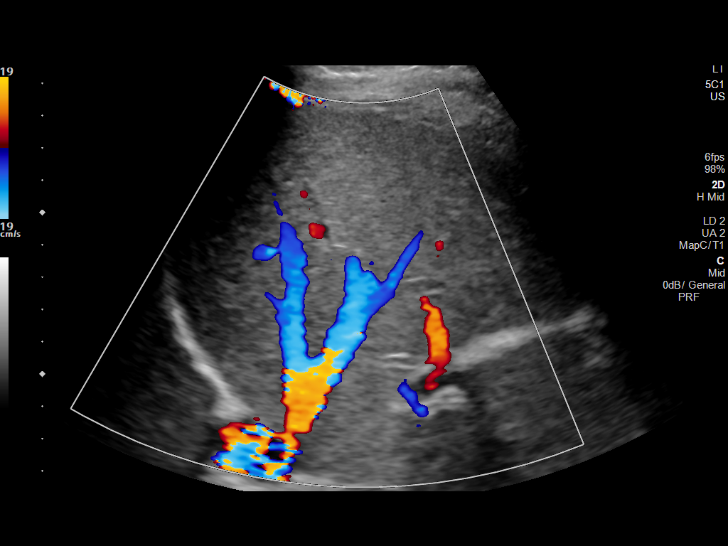
[im 39/43]
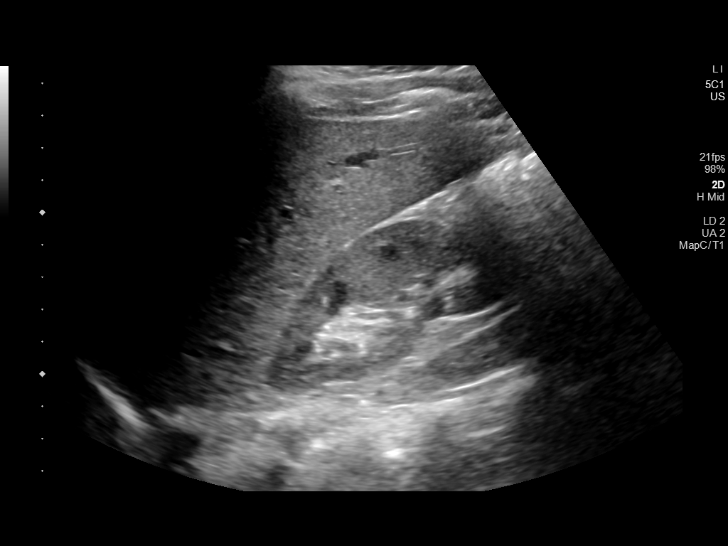
[im 43/43]
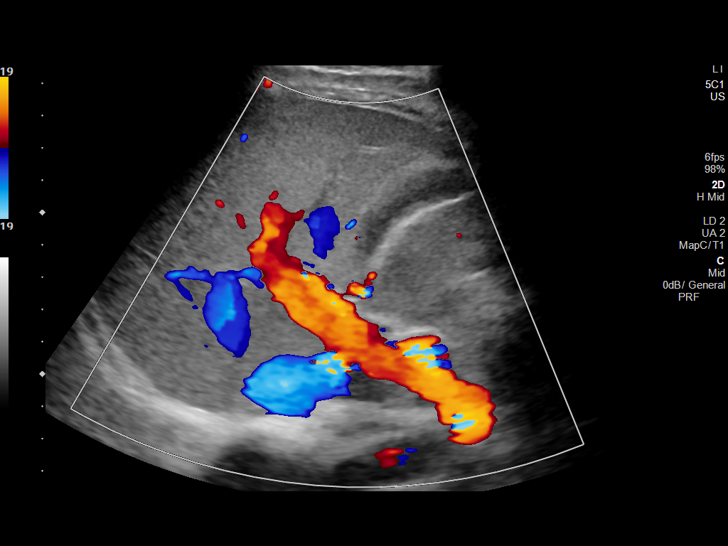

[14 of 25 positions shown; findings below may reference images not displayed]

FINDINGS: Gallbladder:

Incompletely distended. No gallstones, gallbladder wall thickening
or pericholecystic fluid. No sonographic Murphy sign.

Common bile duct:

Diameter: 3 mm, normal

Liver:

Normal echogenicity without mass or nodularity. No intrahepatic
biliary dilatation. Portal vein is patent on color Doppler imaging
with normal direction of blood flow towards the liver.

Other: No RIGHT upper quadrant free fluid.
IMPRESSION: Normal exam.

## 2022-03-10 IMAGING — US US RENAL
1 series · 15 of 25 positions shown · non-contrast
Comparison: None.

CLINICAL DATA: Back pain.  Pregnancy.

EXAM:
RENAL / URINARY TRACT ULTRASOUND COMPLETE

[Series 1: us renal · 15 of 31 slices shown]
[im 1/31]
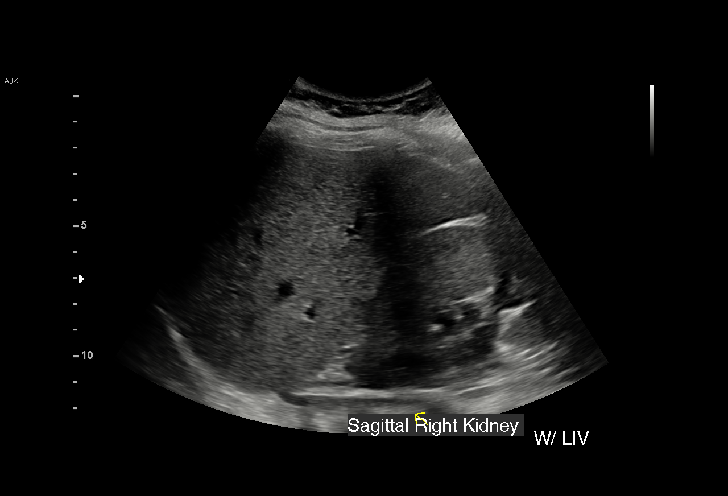
[im 3/31]
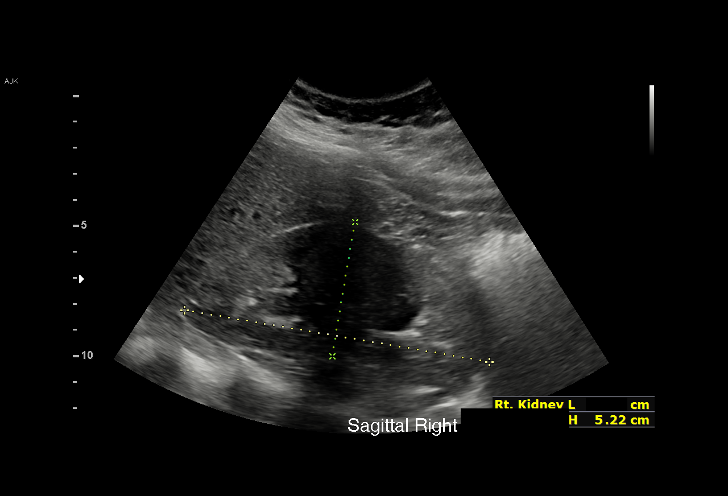
[im 6/31]
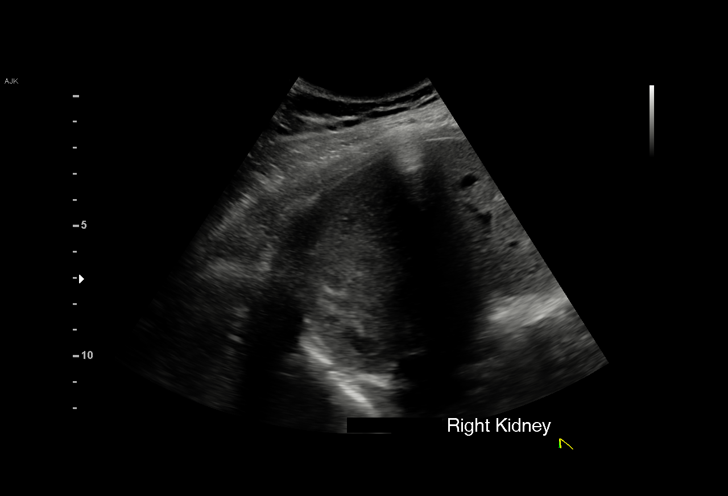
[im 7/31]
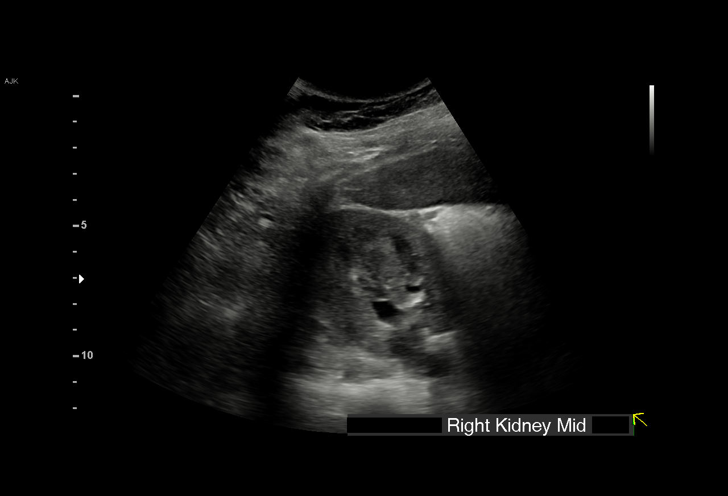
[im 9/31]
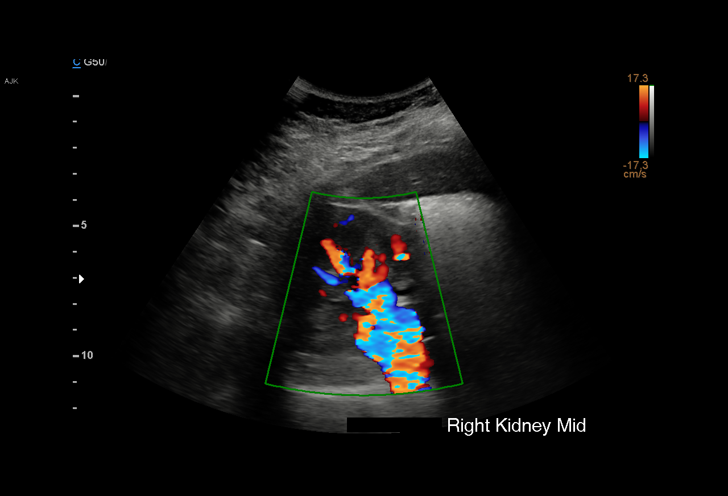
[im 12/31]
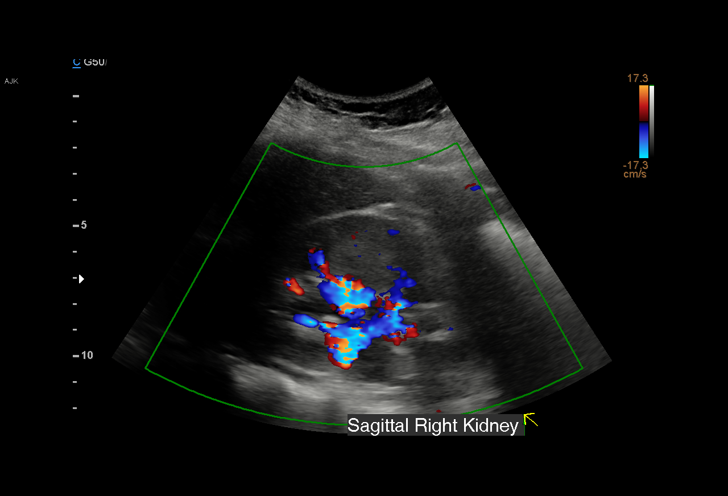
[im 13/31]
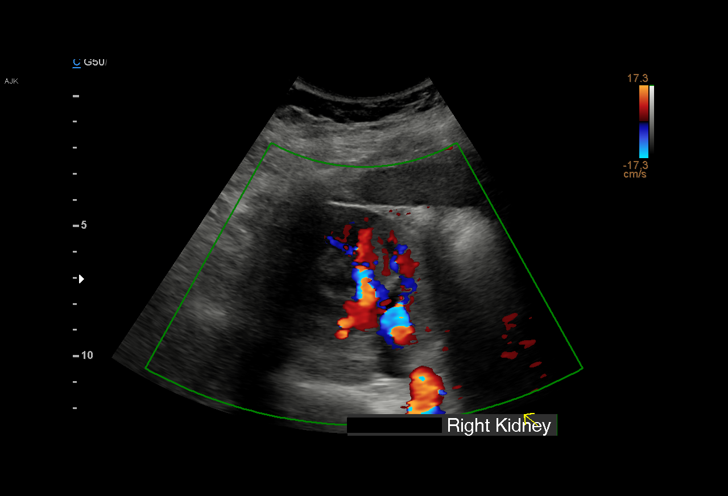
[im 16/31]
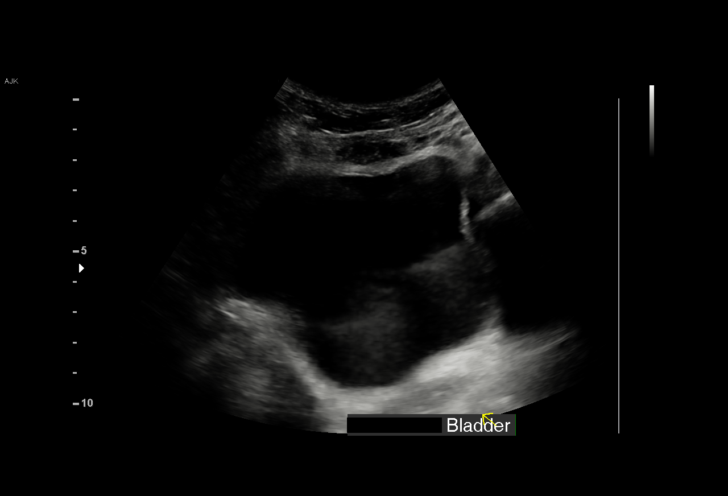
[im 18/31]
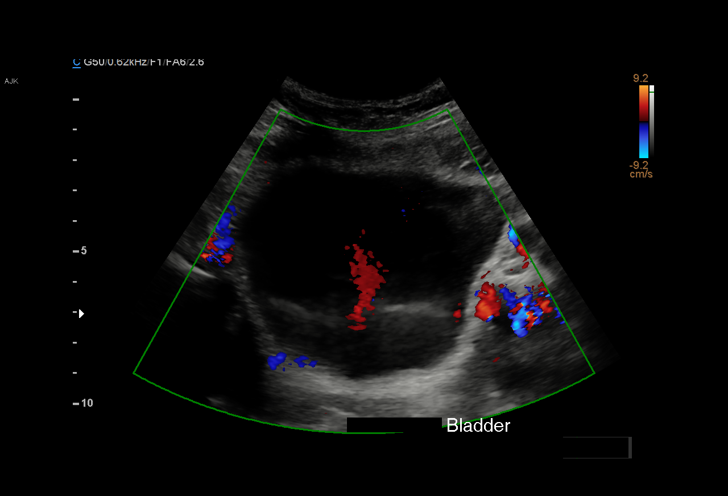
[im 19/31]
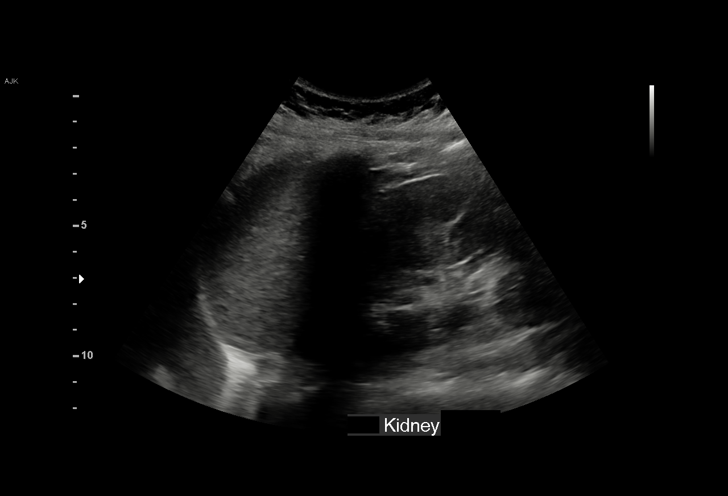
[im 22/31]
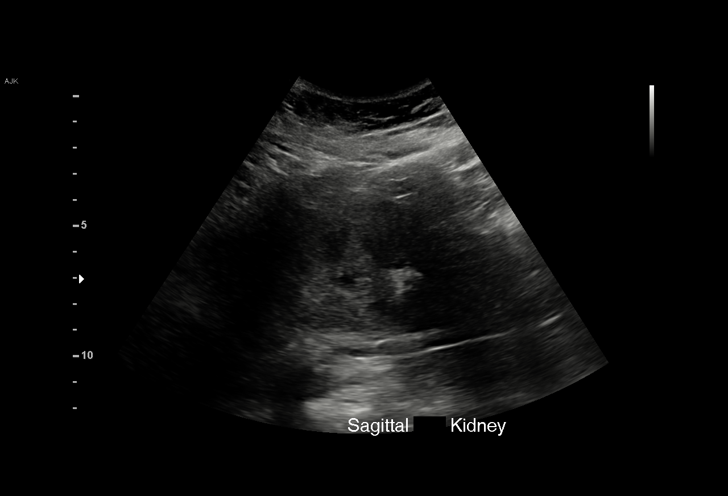
[im 24/31]
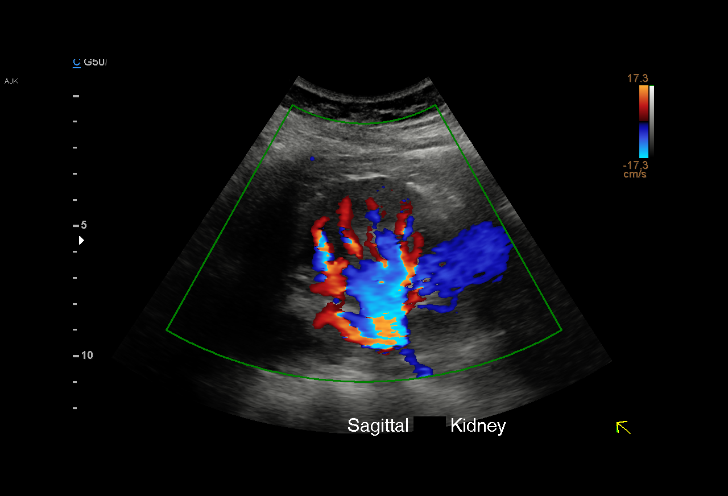
[im 26/31]
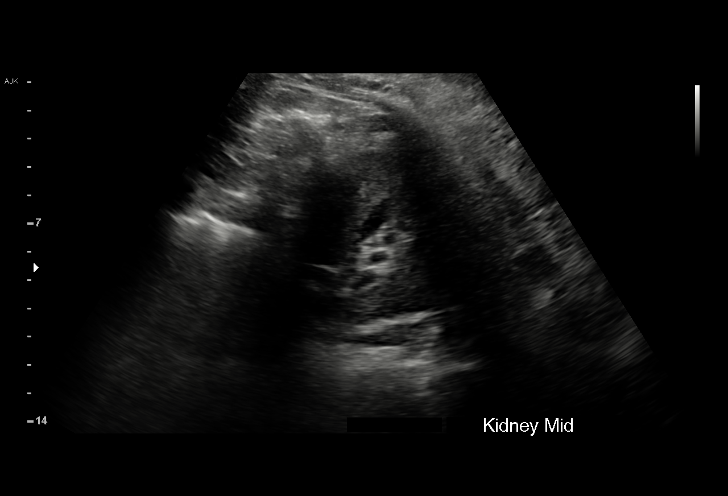
[im 28/31]
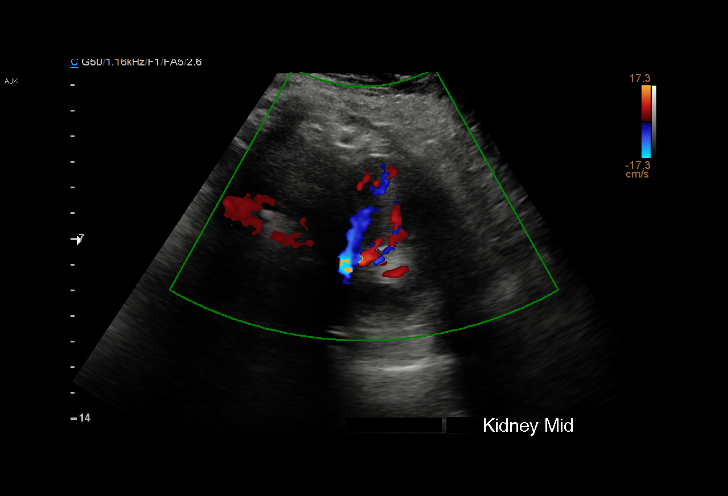
[im 31/31]
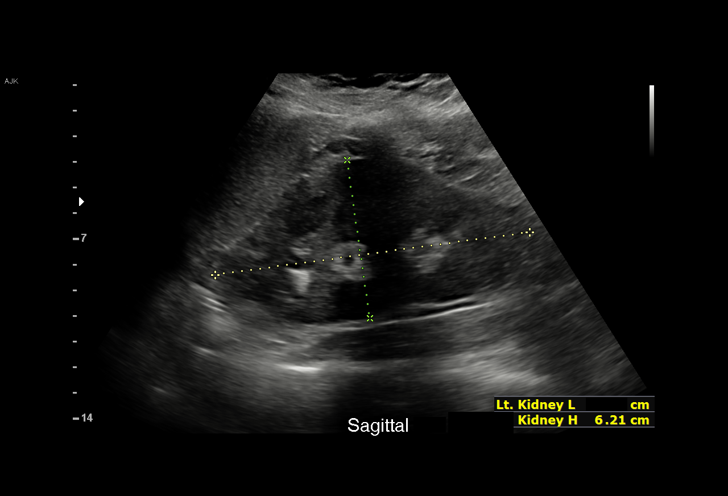

[15 of 25 positions shown; findings below may reference images not displayed]

FINDINGS: Right Kidney:

Renal measurements: 11.9 x 5.2 x 5.6 cm = volume: 187 mL. Mild
hydronephrosis. No nephrolithiasis. No solid mass.

Left Kidney:

Renal measurements: 12.3 x 6.2 x 5.7 cm = volume: 224 mL.
Echogenicity within normal limits. No mass or hydronephrosis
visualized.

Bladder:

Appears normal for degree of bladder distention.

Other:

None.
IMPRESSION: Mild right hydronephrosis.
